# Patient Record
Sex: Female | Born: 1945 | Race: White | Hispanic: No | Smoking: Never smoker
Health system: Southern US, Community
[De-identification: ages and names within clinical notes are randomized; demographics above are authoritative.]

## PROBLEM LIST (undated history)

## (undated) DIAGNOSIS — E785 Hyperlipidemia, unspecified: Secondary | ICD-10-CM

## (undated) DIAGNOSIS — K219 Gastro-esophageal reflux disease without esophagitis: Secondary | ICD-10-CM

## (undated) DIAGNOSIS — F419 Anxiety disorder, unspecified: Secondary | ICD-10-CM

## (undated) HISTORY — DX: Hyperlipidemia, unspecified: E78.5

## (undated) HISTORY — DX: Gastro-esophageal reflux disease without esophagitis: K21.9

## (undated) HISTORY — PX: LITHOTRIPSY: SUR834

## (undated) HISTORY — DX: Anxiety disorder, unspecified: F41.9

---

## 2020-09-23 ENCOUNTER — Telehealth: Payer: Self-pay

## 2020-09-23 NOTE — Telephone Encounter (Signed)
Appointment change for February  Patient aware

## 2020-09-23 NOTE — Telephone Encounter (Signed)
Patient is rescheduled

## 2020-09-23 NOTE — Telephone Encounter (Signed)
Madison King is calling in her and her husband just moved here from [place] and have scheduled New Patient appointment's for March but are running to an issue with medication. They have tried calling their old doctor's office but because they are [place] of state they aren't able to get any refills. Shelsey wants to know if there is any way they can get in before March.

## 2020-10-08 ENCOUNTER — Other Ambulatory Visit: Payer: Self-pay

## 2020-10-08 ENCOUNTER — Encounter: Payer: Self-pay | Admitting: Family Medicine

## 2020-10-08 ENCOUNTER — Ambulatory Visit (INDEPENDENT_AMBULATORY_CARE_PROVIDER_SITE_OTHER): Payer: Medicare HMO | Admitting: Family Medicine

## 2020-10-08 VITALS — BP 154/77 | HR 55 | Temp 97.0°F | Ht 64.0 in | Wt 139.0 lb

## 2020-10-08 DIAGNOSIS — F419 Anxiety disorder, unspecified: Secondary | ICD-10-CM | POA: Insufficient documentation

## 2020-10-08 DIAGNOSIS — E2839 Other primary ovarian failure: Secondary | ICD-10-CM

## 2020-10-08 DIAGNOSIS — I10 Essential (primary) hypertension: Secondary | ICD-10-CM | POA: Diagnosis not present

## 2020-10-08 DIAGNOSIS — E559 Vitamin D deficiency, unspecified: Secondary | ICD-10-CM | POA: Diagnosis not present

## 2020-10-08 DIAGNOSIS — E785 Hyperlipidemia, unspecified: Secondary | ICD-10-CM

## 2020-10-08 DIAGNOSIS — K219 Gastro-esophageal reflux disease without esophagitis: Secondary | ICD-10-CM | POA: Insufficient documentation

## 2020-10-08 NOTE — Assessment & Plan Note (Signed)
Stable.  Continue Prilosec 20 mg daily. 

## 2020-10-08 NOTE — Assessment & Plan Note (Signed)
Slightly above goal though patient reports history of whitecoat hypertension.  She is on Bystolic 10 mg daily and tolerating well.  Continue home monitoring goal 150/90 per JNC 8 guidelines.

## 2020-10-08 NOTE — Progress Notes (Signed)
Madison King is a 75 y.o. female who presents today for an office visit.  Assessment/Plan:  Chronic Problems Addressed Today: Dyslipidemia We will obtain records from previous PCP regarding most recent lipid panel.  Continue pravastatin 20 mg daily.  She will follow-up in 1 year for next annual checkup with labs.  GERD (gastroesophageal reflux disease) Stable.  Continue Prilosec 20 mg daily.  Essential hypertension Slightly above goal though patient reports history of whitecoat hypertension.  She is on Bystolic 10 mg daily and tolerating well.  Continue home monitoring goal 150/90 per JNC 8 guidelines.  Vitamin D deficiency She takes vitamin D 2000 international units daily.  Will trend records from previous PCP.  Also update DEXA scan.  Anxiety Stable.  Uses Xanax very sparingly.  Database with no red flags.  Will refill as needed.  Preventative Healthcare Obtain records from previous PCP.  She should be up-to-date on colonoscopy.  She needs mammogram and bone density scan-we will put in bone density scan order and gave contact info for imaging center.  Will check labs at next office visit.  She is up-to-date on her vaccines.    Subjective:  HPI:  See A/P for status of chronic conditions.  ROS: Per HPI, otherwise a complete review of systems was negative.   PMH:  The following were reviewed and entered/updated in epic: Past Medical History:  Diagnosis Date  . Anxiety   . GERD (gastroesophageal reflux disease)   . Hyperlipidemia    Patient Active Problem List   Diagnosis Date Noted  . Anxiety 10/08/2020  . Vitamin D deficiency 10/08/2020  . Essential hypertension 10/08/2020  . GERD (gastroesophageal reflux disease) 10/08/2020  . Dyslipidemia 10/08/2020   Past Surgical History:  Procedure Laterality Date  . LITHOTRIPSY      Family History  Problem Relation Age of Onset  . Prostate cancer Father   . Colon cancer Neg Hx    Medications- reviewed and  updated Current Outpatient Medications  Medication Sig Dispense Refill  . ALPRAZolam (XANAX) 0.25 MG tablet Take 0.25 mg by mouth at bedtime as needed for anxiety.    . Ascorbic Acid (VITAMIN C) 500 MG CAPS Take by mouth.    . Cholecalciferol (VITAMIN D3) 50 MCG (2000 UT) TABS Take by mouth.    . nebivolol (BYSTOLIC) 10 MG tablet Take 10 mg by mouth.    Marland Kitchen omeprazole (PRILOSEC) 20 MG capsule Take 20 mg by mouth.    . pravastatin (PRAVACHOL) 10 MG tablet Take 10 mg by mouth.     No current facility-administered medications for this visit.    Allergies-reviewed and updated Allergies  Allergen Reactions  . Aspirin   . Latex   . Shellfish Allergy     Social History   Socioeconomic History  . Marital status: Married    Spouse name: Not on file  . Number of children: Not on file  . Years of education: Not on file  . Highest education level: Not on file  Occupational History  . Not on file  Tobacco Use  . Smoking status: Never Smoker  . Smokeless tobacco: Not on file  Substance and Sexual Activity  . Alcohol use: Never  . Drug use: Never  . Sexual activity: Not on file  Other Topics Concern  . Not on file  Social History Narrative  . Not on file   Social Determinants of Health   Financial Resource Strain: Not on file  Food Insecurity: Not on file  Transportation Needs:  Not on file  Physical Activity: Not on file  Stress: Not on file  Social Connections: Not on file        Objective:  Physical Exam: BP (!) 154/77   Pulse (!) 55   Temp (!) 97 F (36.1 C) (Temporal)   Ht 5\' 4"  (1.626 m)   Wt 139 lb (63 kg)   SpO2 96%   BMI 23.86 kg/m   Gen: No acute distress, resting comfortably CV: Regular rate and rhythm with no murmurs appreciated Pulm: Normal work of breathing, clear to auscultation bilaterally with no crackles, wheezes, or rhonchi Neuro: Grossly normal, moves all extremities Psych: Normal affect and thought content       Madison Hendel M. , MD 10/08/2020  12:11 PM

## 2020-10-08 NOTE — Patient Instructions (Addendum)
It was very nice to see you today!  It was very nice to meet you today.  Please keep an eye on your blood pressure let me know if it is persistently 150/90 or higher.  Should get your mammogram and bone density scan done soon.  Let me know when you need refill your medications.  I will see you back in a year for your annual check up with labs.  Please come back to see me sooner if needed.  Take care, Dr Jimmey Ralph  Please try these tips to maintain a healthy lifestyle:   Eat at least 3 REAL meals and 1-2 snacks per day.  Aim for no more than 5 hours between eating.  If you eat breakfast, please do so within one hour of getting up.    Each meal should contain half fruits/vegetables, one quarter protein, and one quarter carbs (no bigger than a computer mouse)   Cut down on sweet beverages. This includes juice, soda, and sweet tea.     Drink at least 1 glass of water with each meal and aim for at least 8 glasses per day   Exercise at least 150 minutes every week.

## 2020-10-08 NOTE — Assessment & Plan Note (Signed)
We will obtain records from previous PCP regarding most recent lipid panel.  Continue pravastatin 20 mg daily.  She will follow-up in 1 year for next annual checkup with labs.

## 2020-10-08 NOTE — Assessment & Plan Note (Signed)
Stable.  Uses Xanax very sparingly.  Database with no red flags.  Will refill as needed.

## 2020-10-08 NOTE — Assessment & Plan Note (Signed)
She takes vitamin D 2000 international units daily.  Will trend records from previous PCP.  Also update DEXA scan.

## 2020-10-11 ENCOUNTER — Other Ambulatory Visit: Payer: Self-pay | Admitting: Family Medicine

## 2020-10-11 DIAGNOSIS — Z1231 Encounter for screening mammogram for malignant neoplasm of breast: Secondary | ICD-10-CM

## 2020-11-17 ENCOUNTER — Ambulatory Visit: Payer: Medicare HMO | Admitting: Family Medicine

## 2020-11-26 ENCOUNTER — Ambulatory Visit
Admission: RE | Admit: 2020-11-26 | Discharge: 2020-11-26 | Disposition: A | Discharge status: Home / Self Care | Payer: Medicare HMO | Source: Ambulatory Visit | Attending: Family Medicine | Admitting: Family Medicine

## 2020-11-26 ENCOUNTER — Other Ambulatory Visit: Payer: Self-pay

## 2020-11-26 DIAGNOSIS — Z1231 Encounter for screening mammogram for malignant neoplasm of breast: Secondary | ICD-10-CM | POA: Diagnosis not present

## 2020-12-10 ENCOUNTER — Telehealth: Payer: Self-pay

## 2020-12-10 NOTE — Telephone Encounter (Signed)
Would be fine with trial of acupuncture but probably need to have her schedule an office visit here to discuss.  Madison King. Jimmey Ralph, MD 12/10/2020 1:45 PM

## 2020-12-10 NOTE — Telephone Encounter (Signed)
[employer] is calling in stating that patient is requesting a referral for out of network acupuncture. Insurance is needing to know if PCP will approve and to fax over office notes.   Fax # [phone] Case # [id number] Phone # [phone] ext. [phone]

## 2020-12-13 NOTE — Telephone Encounter (Signed)
Left message to return call to our office at their convenience to schedule appointment with PCP to discuss referral for acupuncture

## 2020-12-13 NOTE — Telephone Encounter (Signed)
Pt is scheduled °

## 2020-12-27 ENCOUNTER — Other Ambulatory Visit: Payer: Self-pay

## 2020-12-27 ENCOUNTER — Ambulatory Visit (INDEPENDENT_AMBULATORY_CARE_PROVIDER_SITE_OTHER): Payer: Medicare HMO | Admitting: Family Medicine

## 2020-12-27 ENCOUNTER — Encounter: Payer: Self-pay | Admitting: Family Medicine

## 2020-12-27 VITALS — BP 152/68 | HR 57 | Temp 97.6°F | Ht 64.0 in | Wt 140.4 lb

## 2020-12-27 DIAGNOSIS — M542 Cervicalgia: Secondary | ICD-10-CM | POA: Diagnosis not present

## 2020-12-27 DIAGNOSIS — I10 Essential (primary) hypertension: Secondary | ICD-10-CM

## 2020-12-27 DIAGNOSIS — G8929 Other chronic pain: Secondary | ICD-10-CM | POA: Diagnosis not present

## 2020-12-27 NOTE — Patient Instructions (Signed)
It was very nice to see you today!  I will place a referral for acupuncture.  Please let me know how this goes.  No other changes today.  Take care, Dr Jimmey Ralph  PLEASE NOTE:  If you had any lab tests please let us know if you have not heard back within a few days. You may see your results on mychart before we have a chance to review them but we will give you a call once they are reviewed by [place]. If we ordered any referrals today, please let us know if you have not heard from their office within the next week.   Please try these tips to maintain a healthy lifestyle:   Eat at least 3 REAL meals and 1-2 snacks per day.  Aim for no more than 5 hours between eating.  If you eat breakfast, please do so within one hour of getting up.    Each meal should contain half fruits/vegetables, one quarter protein, and one quarter carbs (no bigger than a computer mouse)   Cut down on sweet beverages. This includes juice, soda, and sweet tea.     Drink at least 1 glass of water with each meal and aim for at least 8 glasses per day   Exercise at least 150 minutes every week.

## 2020-12-27 NOTE — Assessment & Plan Note (Signed)
No red flags.  She has done very well with acupuncture.  Will place referral to have this done locally.  May consider referral to PT or sports med depending on her symptoms.

## 2020-12-27 NOTE — Assessment & Plan Note (Signed)
Slightly above goal.  She does have history of whitecoat hypertension.  Home readings have been within normal range.  Continue Bystolic 10 mg daily.  Continue home monitoring.
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
## 2021-01-11 ENCOUNTER — Telehealth: Payer: Self-pay

## 2021-01-11 NOTE — Telephone Encounter (Signed)
Patient returned call did not see any notes of anyone calling

## 2021-02-08 ENCOUNTER — Telehealth: Payer: Self-pay

## 2021-02-08 NOTE — Telephone Encounter (Signed)
  LAST APPOINTMENT DATE: 12/27/2020   NEXT APPOINTMENT DATE:@Visit  date not found  MEDICATION: ALPRAZolam (XANAX) 0.25 MG tablet  PHARMACY: [id number] - [place], [place] - [address]  Please advise

## 2021-02-09 MED ORDER — ALPRAZOLAM 0.25 MG PO TABS: 0.2500 mg | ORAL_TABLET | Freq: Every evening | ORAL | 5 refills | Status: DC | PRN

## 2021-02-09 NOTE — Addendum Note (Signed)
Addended by: Ardith Dark on: 02/09/2021 09:18 AM   Modules accepted: Orders

## 2021-02-09 NOTE — Telephone Encounter (Signed)
Pt is requesting Xanax 0.25 mg tab LOV: 12/27/2020 Next Visit: Not scheduled Last refill: 10/08/2020  Okay to refill?

## 2021-03-09 ENCOUNTER — Other Ambulatory Visit: Payer: Self-pay

## 2021-03-09 ENCOUNTER — Ambulatory Visit
Admission: RE | Admit: 2021-03-09 | Discharge: 2021-03-09 | Disposition: A | Discharge status: Home / Self Care | Payer: Medicare HMO | Source: Ambulatory Visit | Attending: Family Medicine | Admitting: Family Medicine

## 2021-03-09 DIAGNOSIS — E2839 Other primary ovarian failure: Secondary | ICD-10-CM

## 2021-03-09 DIAGNOSIS — Z780 Asymptomatic menopausal state: Secondary | ICD-10-CM | POA: Diagnosis not present

## 2021-03-09 DIAGNOSIS — M85832 Other specified disorders of bone density and structure, left forearm: Secondary | ICD-10-CM | POA: Diagnosis not present

## 2021-03-10 ENCOUNTER — Encounter: Payer: Self-pay | Admitting: Family Medicine

## 2021-03-10 DIAGNOSIS — M8580 Other specified disorders of bone density and structure, unspecified site: Secondary | ICD-10-CM | POA: Insufficient documentation

## 2021-03-10 NOTE — Progress Notes (Signed)
Please inform patient of the following:  Patient has thinning of the bones.  She should make sure that she is getting plenty of calcium and vitamin D in her diet.  We can recheck again in 2 years.

## 2021-03-16 ENCOUNTER — Telehealth: Payer: Self-pay | Admitting: Family Medicine

## 2021-03-16 NOTE — Telephone Encounter (Signed)
Copied from CRM [id number]. Topic: Medicare AWV >> Mar 16, 2021  9:20 AM Harris-Coley, Avon Gully wrote: Reason for CRM: Left message for patient to schedule Annual Wellness Visit.  Please schedule with Nurse Health Advisor Lanier Ensign, RN at [facility].

## 2021-05-16 DIAGNOSIS — M542 Cervicalgia: Secondary | ICD-10-CM | POA: Diagnosis not present

## 2021-06-13 DIAGNOSIS — M542 Cervicalgia: Secondary | ICD-10-CM | POA: Diagnosis not present

## 2021-06-20 ENCOUNTER — Ambulatory Visit (INDEPENDENT_AMBULATORY_CARE_PROVIDER_SITE_OTHER): Payer: Medicare HMO | Admitting: Family Medicine

## 2021-06-20 ENCOUNTER — Encounter: Payer: Self-pay | Admitting: Family Medicine

## 2021-06-20 ENCOUNTER — Other Ambulatory Visit: Payer: Self-pay

## 2021-06-20 VITALS — BP 145/78 | HR 55 | Temp 98.4°F | Ht 62.5 in | Wt 141.2 lb

## 2021-06-20 DIAGNOSIS — K219 Gastro-esophageal reflux disease without esophagitis: Secondary | ICD-10-CM

## 2021-06-20 DIAGNOSIS — Z0001 Encounter for general adult medical examination with abnormal findings: Secondary | ICD-10-CM | POA: Diagnosis not present

## 2021-06-20 DIAGNOSIS — M542 Cervicalgia: Secondary | ICD-10-CM | POA: Diagnosis not present

## 2021-06-20 DIAGNOSIS — G8929 Other chronic pain: Secondary | ICD-10-CM

## 2021-06-20 DIAGNOSIS — E785 Hyperlipidemia, unspecified: Secondary | ICD-10-CM

## 2021-06-20 DIAGNOSIS — E559 Vitamin D deficiency, unspecified: Secondary | ICD-10-CM | POA: Diagnosis not present

## 2021-06-20 DIAGNOSIS — Z6825 Body mass index (BMI) 25.0-25.9, adult: Secondary | ICD-10-CM | POA: Diagnosis not present

## 2021-06-20 DIAGNOSIS — Z1211 Encounter for screening for malignant neoplasm of colon: Secondary | ICD-10-CM

## 2021-06-20 DIAGNOSIS — I10 Essential (primary) hypertension: Secondary | ICD-10-CM

## 2021-06-20 DIAGNOSIS — E663 Overweight: Secondary | ICD-10-CM

## 2021-06-20 DIAGNOSIS — F419 Anxiety disorder, unspecified: Secondary | ICD-10-CM

## 2021-06-20 LAB — CBC
HCT: 42.9 % (ref 36.0–46.0)
Hemoglobin: 14.4 g/dL (ref 12.0–15.0)
MCHC: 33.7 g/dL (ref 30.0–36.0)
MCV: 91.6 fl (ref 78.0–100.0)
Platelets: 208 10*3/uL (ref 150.0–400.0)
RBC: 4.68 Mil/uL (ref 3.87–5.11)
RDW: 12.5 % (ref 11.5–15.5)
WBC: 5.7 10*3/uL (ref 4.0–10.5)

## 2021-06-20 LAB — COMPREHENSIVE METABOLIC PANEL
ALT: 11 U/L (ref 0–35)
AST: 20 U/L (ref 0–37)
Albumin: 4.2 g/dL (ref 3.5–5.2)
Alkaline Phosphatase: 56 U/L (ref 39–117)
BUN: 12 mg/dL (ref 6–23)
CO2: 30 mEq/L (ref 19–32)
Calcium: 9.2 mg/dL (ref 8.4–10.5)
Chloride: 105 mEq/L (ref 96–112)
Creatinine, Ser: 0.92 mg/dL (ref 0.40–1.20)
GFR: 61.58 mL/min (ref >60.00)
Glucose, Bld: 114 mg/dL — ABNORMAL HIGH (ref 70–99)
Potassium: 4.4 mEq/L (ref 3.5–5.1)
Sodium: 141 mEq/L (ref 135–145)
Total Bilirubin: 0.8 mg/dL (ref 0.2–1.2)
Total Protein: 6.7 g/dL (ref 6.0–8.3)

## 2021-06-20 LAB — LIPID PANEL
Cholesterol: 192 mg/dL (ref 0–200)
HDL: 40.5 mg/dL (ref >39.00)
LDL Cholesterol: 127 mg/dL — ABNORMAL HIGH (ref 0–99)
NonHDL: 151.69
Total CHOL/HDL Ratio: 5
Triglycerides: 125 mg/dL (ref 0.0–149.0)
VLDL: 25 mg/dL (ref 0.0–40.0)

## 2021-06-20 LAB — VITAMIN D 25 HYDROXY (VIT D DEFICIENCY, FRACTURES): VITD: 35.39 ng/mL (ref 30.00–100.00)

## 2021-06-20 LAB — TSH: TSH: 1.18 u[IU]/mL (ref 0.35–5.50)

## 2021-06-20 NOTE — Progress Notes (Signed)
Chief Complaint:  Unnamed Madison King is a 75 y.o. female who presents today for her annual comprehensive physical exam.    Assessment/Plan:  Chronic Problems Addressed Today: Chronic neck pain Following with PM&R and transfer orthopedics.  Has had 1 session of acupuncture there which is worked well.  She has had this done for several years in the past in [place].  Dyslipidemia On pravastatin 10 mg daily.  Check lipids today.  GERD (gastroesophageal reflux disease) On omeprazole 20 mg daily. Stable.   Essential hypertension At goal per JNC-8 on Bystolic 10 mg daily.  Check labs.  Vitamin D deficiency Check vit D.   Anxiety Stable. Very rarely uses xanax as needed.  Does not need refill today.   Body mass index is 25.42 kg/m. / Overweight  BMI Metric Follow Up - 06/20/21 1058       BMI Metric Follow Up-Please document annually   BMI Metric Follow Up Education provided              Preventative Healthcare: Check Labs. Cologuard ordered.   Patient Counseling(The following topics were reviewed and/or handout was given):  -Nutrition: Stressed importance of moderation in sodium/caffeine intake, saturated fat and cholesterol, caloric balance, sufficient intake of fresh fruits, vegetables, and fiber.  -Stressed the importance of regular exercise.   -Substance Abuse: Discussed cessation/primary prevention of tobacco, alcohol, or other drug use; driving or other dangerous activities under the influence; availability of treatment for abuse.   -Injury prevention: Discussed safety belts, safety helmets, smoke detector, smoking near bedding or upholstery.   -Sexuality: Discussed sexually transmitted diseases, partner selection, use of condoms, avoidance of unintended pregnancy and contraceptive alternatives.   -Dental health: Discussed importance of regular tooth brushing, flossing, and dental visits.  -Health maintenance and immunizations reviewed. Please refer to Health maintenance  section.  Return to care in 1 year for next preventative visit.     Subjective:  HPI:  She has no acute complaints today.   See A/p for status of chronic conditions.   Lifestyle Diet: Can improve diet by reducing carb intake Exercise: Exercises, but no stated regimen  Depression screen [facility] 2/9 10/08/2020  Decreased Interest 0  Down, Depressed, Hopeless 0  PHQ - 2 Score 0    Health Maintenance Due  Topic Date Due   Hepatitis C Screening  Never done   TETANUS/TDAP  Never done   Fecal DNA (Cologuard)  Never done   Zoster Vaccines- Shingrix (1 of 2) Never done     ROS: Per HPI, otherwise a complete review of systems was negative.   PMH:  The following were reviewed and entered/updated in epic: Past Medical History:  Diagnosis Date   Anxiety    GERD (gastroesophageal reflux disease)    Hyperlipidemia    Patient Active Problem List   Diagnosis Date Noted   Osteopenia - Last DEXA 2022 03/10/2021   Chronic neck pain 12/27/2020   Anxiety 10/08/2020   Vitamin D deficiency 10/08/2020   Essential hypertension 10/08/2020   GERD (gastroesophageal reflux disease) 10/08/2020   Dyslipidemia 10/08/2020   Past Surgical History:  Procedure Laterality Date   LITHOTRIPSY      Family History  Problem Relation Age of Onset   Prostate cancer Father    Heart disease Father    Arthritis Mother    Hearing loss Mother    Cancer Brother    Arthritis Maternal Grandmother    Miscarriages / Stillbirths Maternal Grandmother    Heart disease Maternal Grandfather  Cancer Paternal Grandfather    Colon cancer Neg Hx     Medications- reviewed and updated Current Outpatient Medications  Medication Sig Dispense Refill   ALPRAZolam (XANAX) 0.25 MG tablet Take 1 tablet (0.25 mg total) by mouth at bedtime as needed for anxiety. 30 tablet 5   Ascorbic Acid (VITAMIN C) 500 MG CAPS Take by mouth.     Cholecalciferol (VITAMIN D3) 50 MCG (2000 UT) TABS Take by mouth.     nebivolol  (BYSTOLIC) 10 MG tablet Take 10 mg by mouth.     omeprazole (PRILOSEC) 20 MG capsule Take 20 mg by mouth.     pravastatin (PRAVACHOL) 10 MG tablet Take 10 mg by mouth.     No current facility-administered medications for this visit.    Allergies-reviewed and updated Allergies  Allergen Reactions   Aspirin    Latex    Shellfish Allergy     Social History   Socioeconomic History   Marital status: Divorced    Spouse name: Not on file   Number of children: Not on file   Years of education: Not on file   Highest education level: Not on file  Occupational History   Not on file  Tobacco Use   Smoking status: Never   Smokeless tobacco: Not on file  Substance and Sexual Activity   Alcohol use: Never   Drug use: Never   Sexual activity: Not on file  Other Topics Concern   Not on file  Social History Narrative   Not on file   Social Determinants of Health   Financial Resource Strain: Not on file  Food Insecurity: Not on file  Transportation Needs: Not on file  Physical Activity: Not on file  Stress: Not on file  Social Connections: Not on file        Objective:  Physical Exam: BP (!) 145/78   Pulse (!) 55   Temp 98.4 F (36.9 C)   Ht 5' 2.5" (1.588 m)   Wt 141 lb 4 oz (64.1 kg)   SpO2 99%   BMI 25.42 kg/m   Body mass index is 25.42 kg/m. Wt Readings from Last 3 Encounters:  06/20/21 141 lb 4 oz (64.1 kg)  12/27/20 140 lb 6.4 oz (63.7 kg)  10/08/20 139 lb (63 kg)   Gen: NAD, resting comfortably HEENT: TMs normal bilaterally. OP clear. No thyromegaly noted.  CV: RRR with no murmurs appreciated Pulm: NWOB, CTAB with no crackles, wheezes, or rhonchi GI: Normal bowel sounds present. Soft, Nontender, Nondistended. MSK: no edema, cyanosis, or clubbing noted Skin: warm, dry Neuro: CN2-12 grossly intact. Strength 5/5 in upper and lower extremities. Reflexes symmetric and intact bilaterally.  Psych: Normal affect and thought content     Cambreigh Dearing M. Jimmey Ralph,  MD 06/20/2021 10:58 AM

## 2021-06-20 NOTE — Assessment & Plan Note (Signed)
On pravastatin 10 mg daily.  Check lipids today.

## 2021-06-20 NOTE — Patient Instructions (Signed)
It was very nice to see you today!  We will Check blood work today  I will order the cologuard.  We will see back in 1 year.  Come back sooner if needed.  Take care, Dr Jerline Pain  PLEASE NOTE:  If you had any lab tests please let us know if you have not heard back within a few days. You may see your results on mychart before we have a chance to review them but we will give you a call once they are reviewed by [place]. If we ordered any referrals today, please let us know if you have not heard from their office within the next week.   Please try these tips to maintain a healthy lifestyle:  Eat at least 3 REAL meals and 1-2 snacks per day.  Aim for no more than 5 hours between eating.  If you eat breakfast, please do so within one hour of getting up.   Each meal should contain half fruits/vegetables, one quarter protein, and one quarter carbs (no bigger than a computer mouse)  Cut down on sweet beverages. This includes juice, soda, and sweet tea.   Drink at least 1 glass of water with each meal and aim for at least 8 glasses per day  Exercise at least 150 minutes every week.    Preventive Care 65 Years and Older, Female Preventive care refers to lifestyle choices and visits with your health care provider that can promote health and wellness. This includes: A yearly physical exam. This is also called an annual wellness visit. Regular dental and eye exams. Immunizations. Screening for certain conditions. Healthy lifestyle choices, such as: Eating a healthy diet. Getting regular exercise. Not using drugs or products that contain nicotine and tobacco. Limiting alcohol use. What can I expect for my preventive care visit? Physical exam Your health care provider will check your: Height and weight. These may be used to calculate your BMI (body mass index). BMI is a measurement that tells if you are at a healthy weight. Heart rate and blood pressure. Body temperature. Skin for abnormal  spots. Counseling Your health care provider may ask you questions about your: Past medical problems. Family's medical history. Alcohol, tobacco, and drug use. Emotional well-being. Home life and relationship well-being. Sexual activity. Diet, exercise, and sleep habits. History of falls. Memory and ability to understand (cognition). Work and work Statistician. Pregnancy and menstrual history. Access to firearms. What immunizations do I need? Vaccines are usually given at various ages, according to a schedule. Your health care provider will recommend vaccines for you based on your age, medical history, and lifestyle or other factors, such as travel or where you work. What tests do I need? Blood tests Lipid and cholesterol levels. These may be checked every 5 years, or more often depending on your overall health. Hepatitis C test. Hepatitis B test. Screening Lung cancer screening. You may have this screening every year starting at age 78 if you have a 30-pack-year history of smoking and currently smoke or have quit within the past 15 years. Colorectal cancer screening. All adults should have this screening starting at age 90 and continuing until age 93. Your health care provider may recommend screening at age 50 if you are at increased risk. You will have tests every 1-10 years, depending on your results and the type of screening test. Diabetes screening. This is done by checking your blood sugar (glucose) after you have not eaten for a while (fasting). You may have this done  every 1-3 years. Mammogram. This may be done every 1-2 years. Talk with your health care provider about how often you should have regular mammograms. Abdominal aortic aneurysm (AAA) screening. You may need this if you are a current or former smoker. BRCA-related cancer screening. This may be done if you have a family history of breast, ovarian, tubal, or peritoneal cancers. Other tests STD (sexually transmitted  disease) testing, if you are at risk. Bone density scan. This is done to screen for osteoporosis. You may have this done starting at age 62. Talk with your health care provider about your test results, treatment options, and if necessary, the need for more tests. Follow these instructions at home: Eating and drinking  Eat a diet that includes fresh fruits and vegetables, whole grains, lean protein, and low-fat dairy products. Limit your intake of foods with high amounts of sugar, saturated fats, and salt. Take vitamin and mineral supplements as recommended by your health care provider. Do not drink alcohol if your health care provider tells you not to drink. If you drink alcohol: Limit how much you have to 0-1 drink a day. Be aware of how much alcohol is in your drink. In the [place] one drink equals one 12 oz bottle of beer (355 mL), one 5 oz glass of wine (148 mL), or one 1 oz glass of hard liquor (44 mL). Lifestyle Take daily care of your teeth and gums. Brush your teeth every morning and night with fluoride toothpaste. Floss one time each day. Stay active. Exercise for at least 30 minutes 5 or more days each week. Do not use any products that contain nicotine or tobacco, such as cigarettes, e-cigarettes, and chewing tobacco. If you need help quitting, ask your health care provider. Do not use drugs. If you are sexually active, practice safe sex. Use a condom or other form of protection in order to prevent STIs (sexually transmitted infections). Talk with your health care provider about taking a low-dose aspirin or statin. Find healthy ways to cope with stress, such as: Meditation, yoga, or listening to music. Journaling. Talking to a trusted person. Spending time with friends and family. Safety Always wear your seat belt while driving or riding in a vehicle. Do not drive: If you have been drinking alcohol. Do not ride with someone who has been drinking. When you are tired or  distracted. While texting. Wear a helmet and other protective equipment during sports activities. If you have firearms in your house, make sure you follow all gun safety procedures. What's next? Visit your health care provider once a year for an annual wellness visit. Ask your health care provider how often you should have your eyes and teeth checked. Stay up to date on all vaccines. This information is not intended to replace advice given to you by your health care provider. Make sure you discuss any questions you have with your health care provider. Document Revised: 10/29/2020 Document Reviewed: 08/15/2018 Elsevier Patient Education  2022 [employer].

## 2021-06-20 NOTE — Assessment & Plan Note (Signed)
On omeprazole 20 mg daily. Stable.

## 2021-06-20 NOTE — Assessment & Plan Note (Signed)
At goal per JNC-8 on Bystolic 10 mg daily.  Check labs.

## 2021-06-20 NOTE — Assessment & Plan Note (Signed)
Stable. Very rarely uses xanax as needed.  Does not need refill today.

## 2021-06-20 NOTE — Assessment & Plan Note (Addendum)
Following with [facility] and transfer orthopedics.  Has had 1 session of acupuncture there which is worked well.  She has had this done for several years in the past in [place].

## 2021-06-20 NOTE — Assessment & Plan Note (Signed)
Check vit D 

## 2021-06-21 NOTE — Progress Notes (Signed)
Please inform patient of the following:  Her "bad" cholesterol and glucose are a little elevated but everything else is NORMAL. Do not need to make any changes to her treatment plan at this time. We can recheck in a year.  Madison King. Jimmey Ralph, MD 06/21/2021 7:46 AM

## 2021-06-24 DIAGNOSIS — M542 Cervicalgia: Secondary | ICD-10-CM | POA: Diagnosis not present

## 2021-07-04 DIAGNOSIS — M542 Cervicalgia: Secondary | ICD-10-CM | POA: Diagnosis not present

## 2021-07-11 DIAGNOSIS — M542 Cervicalgia: Secondary | ICD-10-CM | POA: Diagnosis not present

## 2021-07-25 ENCOUNTER — Telehealth: Payer: Self-pay

## 2021-07-25 NOTE — Telephone Encounter (Signed)
Pt called wanting to know what chiropractor Dr Jimmey Ralph would recommend. Pt would like a call back. Please Advise.

## 2021-07-26 NOTE — Telephone Encounter (Signed)
I have no recommendations for a specific chiropractor. If she is looking for osteopathic adjustment then I would recommend Dr Berline Chough at [facility].  Katina Degree. Jimmey Ralph, MD 07/26/2021 8:33 AM

## 2021-07-26 NOTE — Telephone Encounter (Signed)
Left message to return call to office.

## 2021-07-27 ENCOUNTER — Encounter: Payer: Self-pay | Admitting: *Deleted

## 2021-07-27 NOTE — Telephone Encounter (Signed)
Patient notified

## 2021-07-30 ENCOUNTER — Encounter: Payer: Self-pay | Admitting: Family Medicine

## 2021-08-02 NOTE — Telephone Encounter (Signed)
Pt is scheduled °

## 2021-08-02 NOTE — Telephone Encounter (Signed)
Please see message. °

## 2021-08-09 ENCOUNTER — Other Ambulatory Visit: Payer: Self-pay | Admitting: Family Medicine

## 2021-08-10 ENCOUNTER — Other Ambulatory Visit: Payer: Self-pay

## 2021-08-10 ENCOUNTER — Encounter: Payer: Self-pay | Admitting: Family Medicine

## 2021-08-10 ENCOUNTER — Ambulatory Visit (INDEPENDENT_AMBULATORY_CARE_PROVIDER_SITE_OTHER): Payer: Medicare HMO | Admitting: Family Medicine

## 2021-08-10 VITALS — BP 157/84 | HR 57 | Temp 97.6°F | Ht 62.0 in | Wt 144.0 lb

## 2021-08-10 DIAGNOSIS — M542 Cervicalgia: Secondary | ICD-10-CM

## 2021-08-10 DIAGNOSIS — M21611 Bunion of right foot: Secondary | ICD-10-CM | POA: Diagnosis not present

## 2021-08-10 DIAGNOSIS — G8929 Other chronic pain: Secondary | ICD-10-CM

## 2021-08-10 DIAGNOSIS — E785 Hyperlipidemia, unspecified: Secondary | ICD-10-CM

## 2021-08-10 DIAGNOSIS — I10 Essential (primary) hypertension: Secondary | ICD-10-CM

## 2021-08-10 MED ORDER — PRAVASTATIN SODIUM 10 MG PO TABS: 10.0000 mg | ORAL_TABLET | Freq: Every day | ORAL | 3 refills | Status: DC

## 2021-08-10 MED ORDER — NEBIVOLOL HCL 10 MG PO TABS: 10.0000 mg | ORAL_TABLET | Freq: Every day | ORAL | 3 refills | Status: DC

## 2021-08-10 NOTE — Assessment & Plan Note (Signed)
No red flags.  Unfortunately insurance did not pay for acupuncture and she has had trouble with chiropractor.  Will place referral to sports medicine for further evaluation.  She is interested in osteopathic adjustment and possibly dry needling.  Recommended Voltaren gel.

## 2021-08-10 NOTE — Progress Notes (Signed)
   Madison King is a 75 y.o. female who presents today for an office visit.  Assessment/Plan:  New/Acute Problems: Bunion Will refer to podiatry.   Chronic Problems Addressed Today: Chronic neck pain No red flags.  Unfortunately insurance did not pay for acupuncture and she has had trouble with chiropractor.  Will place referral to sports medicine for further evaluation.  She is interested in osteopathic adjustment and possibly dry needling.  Recommended Voltaren gel.  Dyslipidemia Refill pravastatin today.     Subjective:  HPI:  She has a few issues that she would like to discuss today.  Please see A/P for status of her chronic conditions.  Her blood pressure is elevated at this visit. 157/84. She does not check her blood pressure a home because her cuff was giving inconsistent readings. Might be "white-coat syndrome".   She has also had ongoing issues with neck and upper back pain.  This has been going on for over a year.  She went to an acupuncturist but her insurance did not cover it. [facility] to [facility] and was recommended to get a cervical and lumbar x-ray for right shoulder and neck pain.  Her insurance would not cover chiropractor visits until she has an x-ray.  Has been using topical OTC creams which bring little relief. She adds that she bends over reading a lot.   She reports a soft bunion on her big right toe that is non-tender this has been present for months and is progressively worsening.   Would like a refill on 10 mg bystolic and 10 mg pravastatin.       Objective:  Physical Exam: BP (!) 157/84 (BP Location: Left Arm)   Pulse (!) 57   Temp 97.6 F (36.4 C) (Temporal)   Ht 5\' 2"  (1.575 m)   Wt 144 lb (65.3 kg)   BMI 26.34 kg/m   Gen: No acute distress, resting comfortably CV: Regular rate and rhythm with no murmurs appreciated Pulm: Normal work of breathing, clear to auscultation bilaterally with no crackles, wheezes, or rhonchi Neuro: Grossly normal,  moves all extremities Psych: Normal affect and thought content Foot: Bunion on right toe       I,Madison King,acting as a scribe for , MD.,have documented all relevant documentation on the behalf of Madison Doe, MD,as directed by  Madison Doe, MD while in the presence of Madison Doe, MD.   I, Madison Doe, MD, have reviewed all documentation for this visit. The documentation on 08/10/21 for the exam, diagnosis, procedures, and orders are all accurate and complete.  14/07/22. Madison Degree, MD 08/10/2021 11:33 AM

## 2021-08-10 NOTE — Assessment & Plan Note (Signed)
Refill pravastatin today.

## 2021-08-10 NOTE — Patient Instructions (Signed)
It was very nice to see you today!  I will refer you to see a podiatrist for the bunion on your left foot.  I would like for you to see a sports medicine doctor for your upper back and neck pain.  They can help with any sort of adjustments.  They may refer you to see a physical therapist for dry needling.  You could try using Voltaren gel as well.  I will refill your Bystolic and pravastatin today.  Please keep an eye on your blood pressure at home and let me know if it is persistently 140/90 or higher.  Take care, Dr Jimmey Ralph  PLEASE NOTE:  If you had any lab tests please let us know if you have not heard back within a few days. You may see your results on mychart before we have a chance to review them but we will give you a call once they are reviewed by [place]. If we ordered any referrals today, please let us know if you have not heard from their office within the next week.   Please try these tips to maintain a healthy lifestyle:  Eat at least 3 REAL meals and 1-2 snacks per day.  Aim for no more than 5 hours between eating.  If you eat breakfast, please do so within one hour of getting up.   Each meal should contain half fruits/vegetables, one quarter protein, and one quarter carbs (no bigger than a computer mouse)  Cut down on sweet beverages. This includes juice, soda, and sweet tea.   Drink at least 1 glass of water with each meal and aim for at least 8 glasses per day  Exercise at least 150 minutes every week.

## 2021-08-22 ENCOUNTER — Ambulatory Visit (INDEPENDENT_AMBULATORY_CARE_PROVIDER_SITE_OTHER): Payer: Medicare HMO

## 2021-08-22 ENCOUNTER — Encounter: Payer: Self-pay | Admitting: Podiatry

## 2021-08-22 ENCOUNTER — Ambulatory Visit: Payer: Medicare HMO | Admitting: Podiatry

## 2021-08-22 ENCOUNTER — Other Ambulatory Visit: Payer: Self-pay

## 2021-08-22 DIAGNOSIS — M2011 Hallux valgus (acquired), right foot: Secondary | ICD-10-CM | POA: Diagnosis not present

## 2021-08-22 DIAGNOSIS — M67471 Ganglion, right ankle and foot: Secondary | ICD-10-CM | POA: Diagnosis not present

## 2021-08-22 NOTE — Progress Notes (Signed)
°  Subjective:  Patient ID: Madison King, female    DOB: 22-Apr-1946,   MRN: [id number]  Chief Complaint  Patient presents with   Foot Pain    1st MPJ right - soft lump x 2 months, increase in size, not sore, PCP referred for possibly to drain cyst   New Patient (Initial Visit)    75 y.o. female presents for concern of right foot lump present for about two months which has increased in size. Denies any pain. Was referred here by PCP to evaluate for possible cyst. Denies any other treatments. Denies any other pedal complaints. Denies n/v/f/c.   Past Medical History:  Diagnosis Date   Anxiety    GERD (gastroesophageal reflux disease)    Hyperlipidemia     Objective:  Physical Exam: Vascular: DP/PT pulses 2/4 bilateral. CFT <3 seconds. Normal hair growth on digits. No edema.  Skin. No lacerations or abrasions bilateral feet. 1cm fluctuant mass non mobile noted to the medial eminence of right first metatarsophalangeal joint.  Musculoskeletal: MMT 5/5 bilateral lower extremities in DF, PF, Inversion and Eversion. Deceased ROM in DF of ankle joint.  Hallux abductovalgus deformity noted bilateral. No pain to palpation.  Neurological: Sensation intact to light touch.   Assessment:   1. Ganglion cyst of right foot   2. Hav (hallux abducto valgus), right      Plan:  Patient was evaluated and treated and all questions answered. -Xrays reviewed. Mild hav deformity noted with soft tissue swelling noted at medial eminence. Mild degenerative changes noted.  Discussed ganglion cysts and treatment options with the patient. Patient elected to go aspiration of the cyst today.  Procedure note below. Advised patient on postprocedure protocol. -Discussed HAV and treatment options;conservative and surgical management; risks, benefits, alternatives discussed. All patient's questions answered. -Discussed padding and wide shoe gear.   -Recommend continue with good supportive shoes and inserts -Patient to  return to office as needed or sooner if condition worsens.   Procedure: Aspiration cyst, right first metatarsal Discussed alternatives, risks, complications and verbal consent was obtained.  Location: Right medial first metatarsal . Skin Prep: Alcohol. Injectate: 3 cc 1 % lidocaine.  Aspirated cyst with 18 gauge needle, about 3 cc of gel like viscous fluid aspirated consistent with ganglion cyst Disposition: Patient tolerated procedure well. Injection site dressed with a band-aid. Compression bandage applied.  Post-procedure care was discussed and return precautions discussed.     Louann Sjogren, DPM

## 2021-09-12 ENCOUNTER — Ambulatory Visit: Payer: Medicare HMO | Admitting: Sports Medicine

## 2021-10-10 ENCOUNTER — Other Ambulatory Visit: Payer: Self-pay

## 2021-10-10 ENCOUNTER — Encounter: Payer: Self-pay | Admitting: Podiatry

## 2021-10-10 ENCOUNTER — Ambulatory Visit: Payer: Medicare HMO | Admitting: Podiatry

## 2021-10-10 DIAGNOSIS — M67471 Ganglion, right ankle and foot: Secondary | ICD-10-CM

## 2021-10-10 DIAGNOSIS — M2011 Hallux valgus (acquired), right foot: Secondary | ICD-10-CM

## 2021-10-10 NOTE — Progress Notes (Signed)
°  Subjective:  Patient ID: Madison King, female    DOB: July 27, 1946,   MRN: [id number]  Chief Complaint  Patient presents with   Ganglion Cyst     Right foot cyst needs to be drained     76 y.o. female presents for follow-up of right great toe ganglion cyst that has returned.  Denies any pain.  Denies any other treatments. Denies any other pedal complaints. Denies n/v/f/c.   Past Medical History:  Diagnosis Date   Anxiety    GERD (gastroesophageal reflux disease)    Hyperlipidemia     Objective:  Physical Exam: Vascular: DP/PT pulses 2/4 bilateral. CFT <3 seconds. Normal hair growth on digits. No edema.  Skin. No lacerations or abrasions bilateral feet. 1cm fluctuant mass non mobile noted to the medial eminence of right first metatarsophalangeal joint.  Musculoskeletal: MMT 5/5 bilateral lower extremities in DF, PF, Inversion and Eversion. Deceased ROM in DF of ankle joint.  Hallux abductovalgus deformity noted bilateral. No pain to palpation.  Neurological: Sensation intact to light touch.   Assessment:   1. Ganglion cyst of right foot   2. Hav (hallux abducto valgus), right      Plan:  Patient was evaluated and treated and all questions answered. -Xrays reviewed. Discussed ganglion cysts and treatment options with the patient. Patient elected to go aspiration of the cyst today.  Procedure note below. Advised patient on postprocedure protocol. Discussed if cyst return getting MRI and discussing surgical removal.  -Discussed padding and wide shoe gear.   -Recommend continue with good supportive shoes and inserts -Patient to return to office as needed or sooner if condition worsens.   Procedure: Aspiration cyst, right first metatarsal Discussed alternatives, risks, complications and verbal consent was obtained.  Location: Right medial first metatarsal . Skin Prep: Alcohol. Injectate: 3 cc 1 % lidocaine.  Aspirated cyst with 18 gauge needle, about 3 cc of gel like viscous  fluid aspirated consistent with ganglion cyst Disposition: Patient tolerated procedure well. Injection site dressed with a band-aid. Compression bandage applied.  Post-procedure care was discussed and return precautions discussed.     Louann Sjogren, DPM

## 2021-10-27 ENCOUNTER — Other Ambulatory Visit: Payer: Self-pay | Admitting: Family Medicine

## 2021-10-27 DIAGNOSIS — Z1231 Encounter for screening mammogram for malignant neoplasm of breast: Secondary | ICD-10-CM

## 2021-11-10 ENCOUNTER — Telehealth: Payer: Self-pay | Admitting: Family Medicine

## 2021-11-10 NOTE — Telephone Encounter (Signed)
Copied from CRM [id number]. Topic: Medicare AWV ?>> Nov 10, 2021 11:44 AM Harris-Coley, Avon Gully wrote: ?Reason for CRM: Left message for patient to schedule Annual Wellness Visit.  Please schedule with Nurse Health Advisor Lanier Ensign, RN at [facility].  Please call [phone] ask for Olegario Messier ?

## 2021-11-29 ENCOUNTER — Ambulatory Visit
Admission: RE | Admit: 2021-11-29 | Discharge: 2021-11-29 | Disposition: A | Discharge status: Home / Self Care | Payer: Medicare HMO | Source: Ambulatory Visit | Attending: Family Medicine | Admitting: Family Medicine

## 2021-11-29 DIAGNOSIS — Z1231 Encounter for screening mammogram for malignant neoplasm of breast: Secondary | ICD-10-CM | POA: Diagnosis not present

## 2021-12-05 ENCOUNTER — Ambulatory Visit (INDEPENDENT_AMBULATORY_CARE_PROVIDER_SITE_OTHER): Payer: Medicare HMO

## 2021-12-05 DIAGNOSIS — Z0000 Encounter for general adult medical examination without abnormal findings: Secondary | ICD-10-CM

## 2021-12-05 NOTE — Patient Instructions (Signed)
Madison King , ?Thank you for taking time to come for your Medicare Wellness Visit. I appreciate your ongoing commitment to your health goals. Please review the following plan we discussed and let me know if I can assist you in the future.  ? ?Screening recommendations/referrals: ?Colonoscopy: Pt has cologuard kit  ?Mammogram: Done 11/29/21 repeat every year  ?Bone Density: Done 03/09/21 repeat every 2 years  ?Recommended yearly ophthalmology/optometry visit for glaucoma screening and checkup ?Recommended yearly dental visit for hygiene and checkup ? ?Vaccinations: ?Influenza vaccine: Done 05/24/21 repeat every year  ?Pneumococcal vaccine: Up to date ?Tdap vaccine: Due, to be completed every 10 years  ?Shingles vaccine: Done 07/21/19 & 10/25/20   ?Covid-19:Completed 3/16, 4/7, 07/07/20 & 06/10/21 ? ?Advanced directives: total hip arthroplasty ? ?Conditions/risks identified: None at this time ? ?Next appointment: Follow up in one year for your annual wellness visit  ? ? ?Preventive Care 75 Years and Older, Female ?Preventive care refers to lifestyle choices and visits with your health care provider that can promote health and wellness. ?What does preventive care include? ?A yearly physical exam. This is also called an annual well check. ?Dental exams once or twice a year. ?Routine eye exams. Ask your health care provider how often you should have your eyes checked. ?Personal lifestyle choices, including: ?Daily care of your teeth and gums. ?Regular physical activity. ?Eating a healthy diet. ?Avoiding tobacco and drug use. ?Limiting alcohol use. ?Practicing safe sex. ?Taking low-dose aspirin every day. ?Taking vitamin and mineral supplements as recommended by your health care provider. ?What happens during an annual well check? ?The services and screenings done by your health care provider during your annual well check will depend on your age, overall health, lifestyle risk factors, and family history of disease. ?Counseling   ?Your health care provider may ask you questions about your: ?Alcohol use. ?Tobacco use. ?Drug use. ?Emotional well-being. ?Home and relationship well-being. ?Sexual activity. ?Eating habits. ?History of falls. ?Memory and ability to understand (cognition). ?Work and work Statistician. ?Reproductive health. ?Screening  ?You may have the following tests or measurements: ?Height, weight, and BMI. ?Blood pressure. ?Lipid and cholesterol levels. These may be checked every 5 years, or more frequently if you are over 81 years old. ?Skin check. ?Lung cancer screening. You may have this screening every year starting at age 70 if you have a 30-pack-year history of smoking and currently smoke or have quit within the past 15 years. ?Fecal occult blood test (FOBT) of the stool. You may have this test every year starting at age 52. ?Flexible sigmoidoscopy or colonoscopy. You may have a sigmoidoscopy every 5 years or a colonoscopy every 10 years starting at age 10. ?Hepatitis C blood test. ?Hepatitis B blood test. ?Sexually transmitted disease (STD) testing. ?Diabetes screening. This is done by checking your blood sugar (glucose) after you have not eaten for a while (fasting). You may have this done every 1-3 years. ?Bone density scan. This is done to screen for osteoporosis. You may have this done starting at age 6. ?Mammogram. This may be done every 1-2 years. Talk to your health care provider about how often you should have regular mammograms. ?Talk with your health care provider about your test results, treatment options, and if necessary, the need for more tests. ?Vaccines  ?Your health care provider may recommend certain vaccines, such as: ?Influenza vaccine. This is recommended every year. ?Tetanus, diphtheria, and acellular pertussis (Tdap, Td) vaccine. You may need a Td booster every 10 years. ?Zoster vaccine.  You may need this after age 30. ?Pneumococcal 13-valent conjugate (PCV13) vaccine. One dose is recommended  after age 8. ?Pneumococcal polysaccharide (PPSV23) vaccine. One dose is recommended after age 19. ?Talk to your health care provider about which screenings and vaccines you need and how often you need them. ?This information is not intended to replace advice given to you by your health care provider. Make sure you discuss any questions you have with your health care provider. ?Document Released: 09/17/2015 Document Revised: 05/10/2016 Document Reviewed: 06/22/2015 ?Elsevier Interactive Patient Education ? 2017 [place]. ? ?Fall Prevention in the Home ?Falls can cause injuries. They can happen to people of all ages. There are many things you can do to make your home safe and to help prevent falls. ?What can I do on the outside of my home? ?Regularly fix the edges of walkways and driveways and fix any cracks. ?Remove anything that might make you trip as you walk through a door, such as a raised step or threshold. ?Trim any bushes or trees on the path to your home. ?Use bright outdoor lighting. ?Clear any walking paths of anything that might make someone trip, such as rocks or tools. ?Regularly check to see if handrails are loose or broken. Make sure that both sides of any steps have handrails. ?Any raised decks and porches should have guardrails on the edges. ?Have any leaves, snow, or ice cleared regularly. ?Use sand or salt on walking paths during winter. ?Clean up any spills in your garage right away. This includes oil or grease spills. ?What can I do in the bathroom? ?Use night lights. ?Install grab bars by the toilet and in the tub and shower. Do not use towel bars as grab bars. ?Use non-skid mats or decals in the tub or shower. ?If you need to sit down in the shower, use a plastic, non-slip stool. ?Keep the floor dry. Clean up any water that spills on the floor as soon as it happens. ?Remove soap buildup in the tub or shower regularly. ?Attach bath mats securely with double-sided non-slip rug tape. ?Do not  have throw rugs and other things on the floor that can make you trip. ?What can I do in the bedroom? ?Use night lights. ?Make sure that you have a light by your bed that is easy to reach. ?Do not use any sheets or blankets that are too big for your bed. They should not hang down onto the floor. ?Have a firm chair that has side arms. You can use this for support while you get dressed. ?Do not have throw rugs and other things on the floor that can make you trip. ?What can I do in the kitchen? ?Clean up any spills right away. ?Avoid walking on wet floors. ?Keep items that you use a lot in easy-to-reach places. ?If you need to reach something above you, use a strong step stool that has a grab bar. ?Keep electrical cords out of the way. ?Do not use floor polish or wax that makes floors slippery. If you must use wax, use non-skid floor wax. ?Do not have throw rugs and other things on the floor that can make you trip. ?What can I do with my stairs? ?Do not leave any items on the stairs. ?Make sure that there are handrails on both sides of the stairs and use them. Fix handrails that are broken or loose. Make sure that handrails are as long as the stairways. ?Check any carpeting to make sure that it is  firmly attached to the stairs. Fix any carpet that is loose or worn. ?Avoid having throw rugs at the top or bottom of the stairs. If you do have throw rugs, attach them to the floor with carpet tape. ?Make sure that you have a light switch at the top of the stairs and the bottom of the stairs. If you do not have them, ask someone to add them for you. ?What else can I do to help prevent falls? ?Wear shoes that: ?Do not have high heels. ?Have rubber bottoms. ?Are comfortable and fit you well. ?Are closed at the toe. Do not wear sandals. ?If you use a stepladder: ?Make sure that it is fully opened. Do not climb a closed stepladder. ?Make sure that both sides of the stepladder are locked into place. ?Ask someone to hold it for  you, if possible. ?Clearly mark and make sure that you can see: ?Any grab bars or handrails. ?First and last steps. ?Where the edge of each step is. ?Use tools that help you move around (mobility aids) if they

## 2021-12-05 NOTE — Progress Notes (Signed)
Virtual Visit via Telephone Note ? ?I connected with  Madison King on 12/05/21 at 11:00 AM EDT by telephone and verified that I am speaking with the correct person using two identifiers. ? ?Medicare Annual Wellness visit completed telephonically due to Covid-19 pandemic.  ? ?Persons participating in this call: This Health Coach and this patient.  ? ?Location: ?Patient: Home ?Provider: Office  ?  ?I discussed the limitations, risks, security and privacy concerns of performing an evaluation and management service by telephone and the availability of in person appointments. The patient expressed understanding and agreed to proceed. ? ?Unable to perform video visit due to video visit attempted and failed and/or patient does not have video capability.  ? ?Some vital signs may be absent or patient reported.  ? ?Marzella Schlein, LPN ? ? ?Subjective:  ? Madison King is a 76 y.o. female who presents for an Initial Medicare Annual Wellness Visit. ? ?Review of Systems    ? ?Cardiac Risk Factors include: advanced age (>34men, >52 women);dyslipidemia;hypertension ? ?   ?Objective:  ?  ?There were no vitals filed for this visit. ?There is no height or weight on file to calculate BMI. ? ? ?  12/05/2021  ? 10:57 AM  ?Advanced Directives  ?Does Patient Have a Medical Advance Directive? Yes  ?Type of Advance Directive Healthcare Power of Attorney  ?Copy of Healthcare Power of Attorney in Chart? No - copy requested  ? ? ?Current Medications (verified) ?Outpatient Encounter Medications as of 12/05/2021  ?Medication Sig  ? ALPRAZolam (XANAX) 0.25 MG tablet TAKE 1 TABLET BY MOUTH AT BEDTIME AS NEEDED FOR ANXIETY.  ? Ascorbic Acid (VITAMIN C) 500 MG CAPS Take by mouth.  ? Cholecalciferol (VITAMIN D3) 50 MCG (2000 UT) TABS Take by mouth.  ? nebivolol (BYSTOLIC) 10 MG tablet Take 1 tablet (10 mg total) by mouth daily.  ? omeprazole (PRILOSEC) 20 MG capsule Take 20 mg by mouth.  ? pravastatin (PRAVACHOL) 10 MG tablet Take 1 tablet (10 mg total)  by mouth daily.  ? ?No facility-administered encounter medications on file as of 12/05/2021.  ? ? ?Allergies (verified) ?Aspirin, Latex, and Shellfish allergy  ? ?History: ?Past Medical History:  ?Diagnosis Date  ? Anxiety   ? GERD (gastroesophageal reflux disease)   ? Hyperlipidemia   ? ?Past Surgical History:  ?Procedure Laterality Date  ? LITHOTRIPSY    ? ?Family History  ?Problem Relation Age of Onset  ? Prostate cancer Father   ? Heart disease Father   ? Arthritis Mother   ? Hearing loss Mother   ? Cancer Brother   ? Arthritis Maternal Grandmother   ? Miscarriages / Stillbirths Maternal Grandmother   ? Heart disease Maternal Grandfather   ? Cancer Paternal Grandfather   ? Colon cancer Neg Hx   ? ?Social History  ? ?Socioeconomic History  ? Marital status: Divorced  ?  Spouse name: Not on file  ? Number of children: Not on file  ? Years of education: Not on file  ? Highest education level: Not on file  ?Occupational History  ? Not on file  ?Tobacco Use  ? Smoking status: Never  ? Smokeless tobacco: Not on file  ?Substance and Sexual Activity  ? Alcohol use: Never  ? Drug use: Never  ? Sexual activity: Not on file  ?Other Topics Concern  ? Not on file  ?Social History Narrative  ? Not on file  ? ?Social Determinants of Health  ? ?Financial Resource Strain: Low Risk   ?  Difficulty of Paying Living Expenses: Not hard at all  ?Food Insecurity: No Food Insecurity  ? Worried About Programme researcher, broadcasting/film/video in the Last Year: Never true  ? Ran Out of Food in the Last Year: Never true  ?Transportation Needs: No Transportation Needs  ? Lack of Transportation (Medical): No  ? Lack of Transportation (Non-Medical): No  ?Physical Activity: Sufficiently Active  ? Days of Exercise per Week: 3 days  ? Minutes of Exercise per Session: 90 min  ?Stress: No Stress Concern Present  ? Feeling of Stress : Not at all  ?Social Connections: Moderately Isolated  ? Frequency of Communication with Friends and Family: More than three times a week  ?  Frequency of Social Gatherings with Friends and Family: More than three times a week  ? Attends Religious Services: Never  ? Active Member of Clubs or Organizations: Yes  ? Attends Banker Meetings: 1 to 4 times per year  ? Marital Status: Divorced  ? ? ?Tobacco Counseling ?Counseling given: Not Answered ? ? ?Clinical Intake: ? ?Pre-visit preparation completed: Yes ? ?Pain : No/denies pain ? ?  ? ?BMI - recorded: 26.34 ?Nutritional Status: BMI 25 -29 Overweight ?Nutritional Risks: None ?Diabetes: No ? ?How often do you need to have someone help you when you read instructions, pamphlets, or other written materials from your doctor or pharmacy?: 1 - Never ? ?Diabetic?no ? ?Interpreter Needed?: No ? ?Information entered by :: Lanier Ensign, LPN ? ? ?Activities of Daily Living ? ?  12/05/2021  ? 10:59 AM  ?In your present state of health, do you have any difficulty performing the following activities:  ?Hearing? 0  ?Vision? 0  ?Difficulty concentrating or making decisions? 0  ?Walking or climbing stairs? 0  ?Dressing or bathing? 0  ?Doing errands, shopping? 0  ?Preparing Food and eating ? N  ?Using the Toilet? N  ?In the past six months, have you accidently leaked urine? N  ?Do you have problems with loss of bowel control? N  ?Managing your Medications? N  ?Managing your Finances? N  ?Housekeeping or managing your Housekeeping? N  ? ? ?Patient Care Team: ?Ardith Dark, MD as PCP - General (Family Medicine) ? ?Indicate any recent Medical Services you may have received from other than [facility] providers in the past year (date may be approximate). ? ?   ?Assessment:  ? This is a routine wellness examination for [facility]. ? ?Hearing/Vision screen ?Hearing Screening - Comments:: Pt denies any hearing issues  ?Vision Screening - Comments:: Encouraged to follow up with Provider  ? ?Dietary issues and exercise activities discussed: ?Current Exercise Habits: Home exercise routine, Type of exercise: walking;Other - see  comments, Time (Minutes): 60, Frequency (Times/Week): 3, Weekly Exercise (Minutes/Week): 180 ? ? Goals Addressed   ? ?  ?  ?  ?  ? This Visit's Progress  ?  Patient Stated     ?  None at this time  ?  ? ?  ? ?Depression Screen ? ?  12/05/2021  ? 10:56 AM 10/08/2020  ? 11:26 AM  ?PHQ 2/9 Scores  ?PHQ - 2 Score 0 0  ?  ?Fall Risk ? ?  12/05/2021  ? 10:58 AM  ?Fall Risk   ?Falls in the past year? 0  ?Number falls in past yr: 0  ?Injury with Fall? 0  ?Risk for fall due to : Impaired vision  ?Follow up Falls prevention discussed  ? ? ?FALL RISK PREVENTION PERTAINING TO THE  HOME: ? ?Any stairs in or around the home? No  ?If so, are there any without handrails? No  ?Home free of loose throw rugs in walkways, pet beds, electrical cords, etc? Yes  ?Adequate lighting in your home to reduce risk of falls? Yes  ? ?ASSISTIVE DEVICES UTILIZED TO PREVENT FALLS: ? ?Life alert? No  ?Use of a cane, walker or w/c? No  ?Grab bars in the bathroom? Yes  ?Shower chair or bench in shower? Yes  ?Elevated toilet seat or a handicapped toilet? No  ? ?TIMED UP AND GO: ? ?Was the test performed? No .  ? ?Cognitive Function: ?  ?  ? ?  12/05/2021  ? 11:01 AM  ?6CIT Screen  ?What Year? 0 points  ?What month? 0 points  ?What time? 0 points  ?Count back from 20 0 points  ?Months in reverse 0 points  ?Repeat phrase 0 points  ?Total Score 0 points  ? ? ?Immunizations ?Immunization History  ?Administered Date(s) Administered  ? Influenza, High Dose Seasonal PF 05/24/2021  ? Influenza-Unspecified 06/04/2020  ? PFIZER(Purple Top)SARS-COV-2 Vaccination 11/18/2019, 12/10/2019, 07/07/2020, 06/10/2021  ? Pneumococcal Conjugate-13 10/05/2015  ? Pneumococcal Polysaccharide-23 12/25/2016  ? Zoster Recombinat (Shingrix) 07/21/2019, 10/25/2020  ? ? ?TDAP status: Due, Education has been provided regarding the importance of this vaccine. Advised may receive this vaccine at local pharmacy or Health Dept. Aware to provide a copy of the vaccination record if obtained from  local pharmacy or Health Dept. Verbalized acceptance and understanding. ? ?Flu Vaccine status: Up to date ? ?Pneumococcal vaccine status: Up to date ? ?Covid-19 vaccine status: Completed vaccines ? ?Alger MemosQualifi

## 2021-12-26 ENCOUNTER — Ambulatory Visit (INDEPENDENT_AMBULATORY_CARE_PROVIDER_SITE_OTHER): Payer: Medicare HMO | Admitting: Family Medicine

## 2021-12-26 ENCOUNTER — Encounter: Payer: Self-pay | Admitting: Family Medicine

## 2021-12-26 VITALS — BP 147/64 | HR 53 | Temp 98.5°F | Ht 62.0 in | Wt 144.6 lb

## 2021-12-26 DIAGNOSIS — M542 Cervicalgia: Secondary | ICD-10-CM

## 2021-12-26 DIAGNOSIS — G8929 Other chronic pain: Secondary | ICD-10-CM | POA: Diagnosis not present

## 2021-12-26 DIAGNOSIS — I10 Essential (primary) hypertension: Secondary | ICD-10-CM | POA: Diagnosis not present

## 2021-12-26 NOTE — Progress Notes (Signed)
? ?  Madison King is a 76 y.o. female who presents today for an office visit. ? ?Assessment/Plan:  ?Chronic Problems Addressed Today: ?Chronic neck pain ?No red flags.  Symptoms are not controlled.  She has had acupuncture in the past and has done well with this however insurance would not pay for this.  She would like to have a trial of physical therapy.  Think this would be a good idea.  We will place referral today. ? ?Essential hypertension ?At goal per JNC 8 on Bystolic 10 mg daily. ? ?  ?Subjective:  ?HPI: ? ?See A/p for status of chronic conditions.   ? ?   ?  ?Objective:  ?Physical Exam: ?BP (!) 147/64   Pulse (!) 53   Temp 98.5 ?F (36.9 ?C) (Temporal)   Ht 5\' 2"  (1.575 m)   Wt 144 lb 9.6 oz (65.6 kg)   SpO2 99%   BMI 26.45 kg/m?   ?Gen: No acute distress, resting comfortably ?Neuro: Grossly normal, moves all extremities ?Psych: Normal affect and thought content ? ?   ? ?Algis Greenhouse. Jerline Pain, MD ?12/26/2021 1:21 PM  ?

## 2021-12-26 NOTE — Assessment & Plan Note (Signed)
No red flags.  Symptoms are not controlled.  She has had acupuncture in the past and has done well with this however insurance would not pay for this.  She would like to have a trial of physical therapy.  Think this would be a good idea.  We will place referral today. ?

## 2021-12-26 NOTE — Assessment & Plan Note (Signed)
At goal per JNC 8 on Bystolic 10 mg daily. ?

## 2021-12-26 NOTE — Patient Instructions (Signed)
It was very nice to see you today! ? ?I will refer you to see a physical therapist in our office.  ? ?Please let us know if we can do anything else to help. ? ?Take care, ?Dr Jerline Pain ? ?PLEASE NOTE: ? ?If you had any lab tests please let us know if you have not heard back within a few days. You may see your results on mychart before we have a chance to review them but we will give you a call once they are reviewed by [place]. If we ordered any referrals today, please let us know if you have not heard from their office within the next week.  ? ?Please try these tips to maintain a healthy lifestyle: ? ?Eat at least 3 REAL meals and 1-2 snacks per day.  Aim for no more than 5 hours between eating.  If you eat breakfast, please do so within one hour of getting up.  ? ?Each meal should contain half fruits/vegetables, one quarter protein, and one quarter carbs (no bigger than a computer mouse) ? ?Cut down on sweet beverages. This includes juice, soda, and sweet tea.  ? ?Drink at least 1 glass of water with each meal and aim for at least 8 glasses per day ? ?Exercise at least 150 minutes every week.   ?

## 2022-01-02 ENCOUNTER — Telehealth: Payer: Self-pay | Admitting: Family Medicine

## 2022-01-02 ENCOUNTER — Ambulatory Visit: Payer: Medicare HMO | Admitting: Physical Therapy

## 2022-01-02 DIAGNOSIS — M542 Cervicalgia: Secondary | ICD-10-CM | POA: Diagnosis not present

## 2022-01-02 DIAGNOSIS — M62838 Other muscle spasm: Secondary | ICD-10-CM

## 2022-01-02 NOTE — Telephone Encounter (Signed)
Pt received it a Tdap at [facility] on [address] on 12/26/21. She would like her chart to be updated ?

## 2022-01-02 NOTE — Telephone Encounter (Signed)
Updated immunization records
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
## 2022-01-09 ENCOUNTER — Ambulatory Visit: Payer: Medicare HMO | Admitting: Physical Therapy

## 2022-01-09 ENCOUNTER — Encounter: Payer: Self-pay | Admitting: Physical Therapy

## 2022-01-09 DIAGNOSIS — M542 Cervicalgia: Secondary | ICD-10-CM | POA: Diagnosis not present

## 2022-01-09 DIAGNOSIS — M62838 Other muscle spasm: Secondary | ICD-10-CM

## 2022-01-09 NOTE — Therapy (Signed)
?OUTPATIENT PHYSICAL THERAPY TREATMENT  ? ? ?Patient Name: Madison King ?MRN: [id number] ?DOB:01/15/1946, 76 y.o., female ?Today's Date: 01/09/2022 ? ? PT End of Session - 01/09/22 1426   ? ? Visit Number 2   ? Number of Visits 16   ? Date for PT Re-Evaluation 02/27/22   ? Authorization Type [employer]   ? PT Start Time 1430   ? PT Stop Time 1510   ? PT Time Calculation (min) 40 min   ? Activity Tolerance Patient tolerated treatment well   ? Behavior During Therapy [facility] for tasks assessed/performed   ? ?  ?  ? ?  ? ? ?Past Medical History:  ?Diagnosis Date  ? Anxiety   ? GERD (gastroesophageal reflux disease)   ? Hyperlipidemia   ? ?Past Surgical History:  ?Procedure Laterality Date  ? LITHOTRIPSY    ? ?Patient Active Problem List  ? Diagnosis Date Noted  ? Osteopenia - Last DEXA 2022 03/10/2021  ? Chronic neck pain 12/27/2020  ? Anxiety 10/08/2020  ? Vitamin D deficiency 10/08/2020  ? Essential hypertension 10/08/2020  ? GERD (gastroesophageal reflux disease) 10/08/2020  ? Dyslipidemia 10/08/2020  ? ? ?PCP: [name] ? ?REFERRING PROVIDER: [name] ? ?REFERRING DIAG: Neck Pain ? ?THERAPY DIAG:  ?Cervicalgia ? ?Other muscle spasm ? ? ?ONSET DATE:  ? ?SUBJECTIVE:                                                                                                                                                                                     ? ?SUBJECTIVE STATEMENT: ? ?Pt states pain in R more than L UT today. States increased pain in low back for a couple days, after eval. Now doing better. She is very hesitant to lay supine, due to previous back pain about 4 yrs ago.   ? ?PERTINENT HISTORY: ?Chronic sinusitis, root canal, Facial pain, ? ?PAIN:  ?Are you having pain? Yes: NPRS scale: 8/10 ?Pain location: bil neck pain into UT s  ?Pain description: Soreness, tightness ?Aggravating factors: reading, doing art work, driving.  ?Relieving factors: Heat ? ?PRECAUTIONS: None ? ?WEIGHT BEARING RESTRICTIONS No ? ?FALLS:  ?Has  patient fallen in last 6 months? No ? ? ?PLOF: Independent ? ?PATIENT GOALS     ? ?OBJECTIVE:  ? ?COGNITION: ? Overall cognitive status: Within functional limits for tasks assessed ?    ?SENSATION: ?WFL,  ? ? ?UPPER EXTREMITY ROM:  ?  Cervical: WFL ; Shoulder AROM: mild limitation for full flexion and abd/pain. PROM: mild limitation for flexion. Some pain at end range ER ? ?UPPER EXTREMITY MMT: ?  Shoulder: 4/5, mild pain with ER testing.  ? ?PALPATION:  ?  Tightness, trigger points in bil UT s, tenderness in levator, paraspinals, and SO. ?  ?TODAY'S TREATMENT:  ?01/09/2022 ? ?Therapeutic Exercise: ?Aerobic: ?Supine: ?S/L: L shoulder ER 1lb x 15;  ?Seated: Scap squeeze x 10; Shoulder rolls x 10; Pulley(for L shoulder ROM) flexion x 10; Education and practice for achieving optimal seated/upright posture; Seated pelvic tilts x 10;  ?Standing: Wall slides x 10 on L (for shoulder ROM);  ?Stretches: UT and levator stretch 30 sec x 2 bil;  ?Neuromuscular Re-education: ?Manual Therapy: DTM/TPR/ and tennis ball  to R UT and levator ?Self Care: ? ? ?PATIENT EDUCATION: ?Education details: Posture, HEP, benefits of DN,  ?Person educated: Patient ?Education method: Explanation, Demonstration, Tactile cues, Verbal cues, and Handouts ?Education comprehension: verbalized understanding, returned demonstration, verbal cues required, tactile cues required, and needs further education ? ? ?HOME EXERCISE PROGRAM: ?Access Code: [email or web address] ? ? ?ASSESSMENT: ? ?CLINICAL IMPRESSION: ?Pt with much muscle tension and soreness in R UT and levator today, with active trigger points. Discussed benefits of DN in future. Pt hesitant for laying supine today (for back pain), so less manual for c-spine able to be done. Did do ROM for L shoulder in seated/standing positions, pt with good ability for this, will continue postural strengthening as able. Pt with improved seated posture after education and practice today.   ? ? ?OBJECTIVE IMPAIRMENTS decreased  activity tolerance, decreased knowledge of use of DME, decreased mobility, decreased ROM, decreased strength, increased muscle spasms, impaired flexibility, impaired UE functional use, improper body mechanics, and pain.  ? ?ACTIVITY LIMITATIONS cleaning, community activity, driving, meal prep, laundry, yard work, and shopping.  ? ?PERSONAL FACTORS Time since onset of injury/illness/exacerbation are also affecting patient's functional outcome.  ? ? ?REHAB POTENTIAL: Good ? ?CLINICAL DECISION MAKING: Stable/uncomplicated ? ?EVALUATION COMPLEXITY: Low ? ? ?GOALS: ?Goals reviewed with patient? Yes ? ?SHORT TERM GOALS: Target date: 01/16/22 ? ?Pt to be independent with initial HEP ? ?Goal status: INITIAL ? ?2.  Pt to demo decreased pain, trigger points, and muscle soreness by at least 50 %.  ? ?Goal status: INITIAL ? ? ? ?LONG TERM GOALS: Target date: 02/27/22 - 8 Weeks.  ? ?Pt to be independent with final HEP ? ?Goal status: INITIAL ? ?2.  Pt to demo cervical ROM to be WNL/pain free, to improve ability for driving and ADLs.  ? ?Goal status: INITIAL ? ?3.  Pt to report decreased pain in neck and shoulders to 0-2/10 with activity.  ? ?Goal status: INITIAL ? ?4.  Pt to demo ability to self correct posture in clinic, at least 75 % of time, and will report optimal posture with sitting, reading, and art work at home, for improved pain.  ? ?Goal status: INITIAL ? ? ? ? ?PLAN: ?PT FREQUENCY: 2x/week ? ?PT DURATION: 8 weeks ? ?PLANNED INTERVENTIONS: Therapeutic exercises, Therapeutic activity, Neuromuscular re-education, Patient/Family education, Joint manipulation, Joint mobilization, DME instructions, Dry Needling, Electrical stimulation, Spinal manipulation, Spinal mobilization, Cryotherapy, Moist heat, Taping, Traction, Ultrasound, Ionotophoresis 4mg /ml Dexamethasone, and Manual therapy ? ?PLAN FOR NEXT SESSION: Manual for muscle tension, DN , Scapular awareness and strengthening.  ? ? ? , PT, DPT ?3:42 PM   01/09/22 ? ? ?

## 2022-01-11 ENCOUNTER — Ambulatory Visit: Payer: Medicare HMO | Admitting: Physical Therapy

## 2022-01-11 ENCOUNTER — Encounter: Payer: Self-pay | Admitting: Physical Therapy

## 2022-01-11 DIAGNOSIS — M62838 Other muscle spasm: Secondary | ICD-10-CM

## 2022-01-11 DIAGNOSIS — M542 Cervicalgia: Secondary | ICD-10-CM | POA: Diagnosis not present

## 2022-01-11 NOTE — Therapy (Signed)
?OUTPATIENT PHYSICAL THERAPY TREATMENT  ? ? ?Patient Name: Madison King ?MRN: [id number] ?DOB:04-Jan-1946, 76 y.o., female ?Today's Date: 01/11/2022 ? ? PT End of Session - 01/11/22 1302   ? ? Visit Number 3   ? Number of Visits 16   ? Date for PT Re-Evaluation 02/27/22   ? Authorization Type [employer]   ? PT Start Time 1304   ? PT Stop Time 1345   ? PT Time Calculation (min) 41 min   ? Activity Tolerance Patient tolerated treatment well   ? Behavior During Therapy [facility] for tasks assessed/performed   ? ?  ?  ? ?  ? ? ?Past Medical History:  ?Diagnosis Date  ? Anxiety   ? GERD (gastroesophageal reflux disease)   ? Hyperlipidemia   ? ?Past Surgical History:  ?Procedure Laterality Date  ? LITHOTRIPSY    ? ?Patient Active Problem List  ? Diagnosis Date Noted  ? Osteopenia - Last DEXA 2022 03/10/2021  ? Chronic neck pain 12/27/2020  ? Anxiety 10/08/2020  ? Vitamin D deficiency 10/08/2020  ? Essential hypertension 10/08/2020  ? GERD (gastroesophageal reflux disease) 10/08/2020  ? Dyslipidemia 10/08/2020  ? ? ?PCP: [name] ? ?REFERRING PROVIDER: [name] ? ?REFERRING DIAG: Neck Pain ? ?THERAPY DIAG:  ?Cervicalgia ? ?Other muscle spasm ? ? ?ONSET DATE:  ? ?SUBJECTIVE:                                                                                                                                                                                     ? ?SUBJECTIVE STATEMENT: ?Pt states less pain in neck today. Has been doing HEP ? ? ?PERTINENT HISTORY: ?Chronic sinusitis, root canal, Facial pain, ? ?PAIN:  ?Are you having pain? Yes: NPRS scale: 8/10 ?Pain location: bil neck pain into UT s  ?Pain description: Soreness, tightness ?Aggravating factors: reading, doing art work, driving.  ?Relieving factors: Heat ? ?PRECAUTIONS: None ? ?WEIGHT BEARING RESTRICTIONS No ? ?FALLS:  ?Has patient fallen in last 6 months? No ? ? ?PLOF: Independent ? ?PATIENT GOALS     ? ?OBJECTIVE:  ? ?COGNITION: ? Overall cognitive status: Within  functional limits for tasks assessed ?    ?SENSATION: ?WFL,  ? ? ?UPPER EXTREMITY ROM:  ?  Cervical: WFL ; Shoulder AROM: mild limitation for full flexion and abd/pain. PROM: mild limitation for flexion. Some pain at end range ER ? ?UPPER EXTREMITY MMT: ?  Shoulder: 4/5, mild pain with ER testing.  ? ?PALPATION:  ?Tightness, trigger points in bil UT s, tenderness in levator, paraspinals, and SO. ?  ?TODAY'S TREATMENT:  ?01/11/2022 ? ?Therapeutic Exercise: ?Aerobic: ?Supine: ?S/L:  ?Seated:  Seated pelvic tilts  x 10;  ?Standing: Wall slides x 10 on L ; Shoulder ER YTB x 20;  ?Stretches: UT and levator stretch 30 sec x 2 bil;  Standing shoulder flexion at rail x 2 min; Seated shoulder ER butterfly x 10;  ?Neuromuscular Re-education: ?Manual Therapy: STM/DTM  to  bil UT and levator ?Self Care:  ? ? ?PATIENT EDUCATION: ?Education details: Posture, HEP, Reviewed use of heat (with pts heating pad)  ?Person educated: Patient ?Education method: Explanation, Demonstration, Tactile cues, Verbal cues, and Handouts ?Education comprehension: verbalized understanding, returned demonstration, verbal cues required, tactile cues required, and needs further education ? ? ?HOME EXERCISE PROGRAM: ?Access Code: [email or web address] ? ? ?ASSESSMENT: ? ?CLINICAL IMPRESSION: ?Pt with decreased soreness in bil UT s today, does have some tenderness with palpation, but much less pain with active cervical ROM today. Continued with ther ex for postural strength and shoulder ROM, will benefit from progressive strengthening and posture education as tolerated.  ? ? ?OBJECTIVE IMPAIRMENTS decreased activity tolerance, decreased knowledge of use of DME, decreased mobility, decreased ROM, decreased strength, increased muscle spasms, impaired flexibility, impaired UE functional use, improper body mechanics, and pain.  ? ?ACTIVITY LIMITATIONS cleaning, community activity, driving, meal prep, laundry, yard work, and shopping.  ? ?PERSONAL FACTORS Time since onset of  injury/illness/exacerbation are also affecting patient's functional outcome.  ? ? ?REHAB POTENTIAL: Good ? ?CLINICAL DECISION MAKING: Stable/uncomplicated ? ?EVALUATION COMPLEXITY: Low ? ? ?GOALS: ?Goals reviewed with patient? Yes ? ?SHORT TERM GOALS: Target date: 01/16/22 ? ?Pt to be independent with initial HEP ? ?Goal status: INITIAL ? ?2.  Pt to demo decreased pain, trigger points, and muscle soreness by at least 50 %.  ? ?Goal status: INITIAL ? ? ? ?LONG TERM GOALS: Target date: 02/27/22 - 8 Weeks.  ? ?Pt to be independent with final HEP ? ?Goal status: INITIAL ? ?2.  Pt to demo cervical ROM to be WNL/pain free, to improve ability for driving and ADLs.  ? ?Goal status: INITIAL ? ?3.  Pt to report decreased pain in neck and shoulders to 0-2/10 with activity.  ? ?Goal status: INITIAL ? ?4.  Pt to demo ability to self correct posture in clinic, at least 75 % of time, and will report optimal posture with sitting, reading, and art work at home, for improved pain.  ? ?Goal status: INITIAL ? ? ? ? ?PLAN: ?PT FREQUENCY: 2x/week ? ?PT DURATION: 8 weeks ? ?PLANNED INTERVENTIONS: Therapeutic exercises, Therapeutic activity, Neuromuscular re-education, Patient/Family education, Joint manipulation, Joint mobilization, DME instructions, Dry Needling, Electrical stimulation, Spinal manipulation, Spinal mobilization, Cryotherapy, Moist heat, Taping, Traction, Ultrasound, Ionotophoresis 4mg /ml Dexamethasone, and Manual therapy ? ?PLAN FOR NEXT SESSION: Manual for muscle tension, DN , Scapular awareness and strengthening.  ? ? ? , PT, DPT ?1:53 PM  01/11/22 ? ? ?

## 2022-01-12 ENCOUNTER — Encounter: Payer: Medicare HMO | Admitting: Physical Therapy

## 2022-01-18 ENCOUNTER — Ambulatory Visit: Payer: Medicare HMO | Admitting: Physical Therapy

## 2022-01-18 DIAGNOSIS — M542 Cervicalgia: Secondary | ICD-10-CM

## 2022-01-18 DIAGNOSIS — M62838 Other muscle spasm: Secondary | ICD-10-CM

## 2022-01-18 NOTE — Therapy (Signed)
OUTPATIENT PHYSICAL THERAPY TREATMENT    Patient Name: Madison King MRN: [id number] DOB:July 06, 1946, 76 y.o., female Today's Date: 01/19/2022   PT End of Session - 01/19/22 1328     Visit Number 4    Number of Visits 16    Date for PT Re-Evaluation 02/27/22    Authorization Type [employer]    PT Start Time 1218    PT Stop Time [id number]    PT Time Calculation (min) 40 min    Activity Tolerance Patient tolerated treatment well    Behavior During Therapy [facility] for tasks assessed/performed              Past Medical History:  Diagnosis Date   Anxiety    GERD (gastroesophageal reflux disease)    Hyperlipidemia    Past Surgical History:  Procedure Laterality Date   LITHOTRIPSY     Patient Active Problem List   Diagnosis Date Noted   Osteopenia - Last DEXA 2022 03/10/2021   Chronic neck pain 12/27/2020   Anxiety 10/08/2020   Vitamin D deficiency 10/08/2020   Essential hypertension 10/08/2020   GERD (gastroesophageal reflux disease) 10/08/2020   Dyslipidemia 10/08/2020    PCP: Dimas Chyle  REFERRING PROVIDER: Dimas Chyle  REFERRING DIAG: Neck Pain  THERAPY DIAG:  Cervicalgia  Other muscle spasm   ONSET DATE:   SUBJECTIVE:                                                                                                                                                                                      SUBJECTIVE STATEMENT:  Pt states less pain in neck overall. Still has some tension daily. Has been doing HEP   PERTINENT HISTORY: Chronic sinusitis, root canal, Facial pain,  PAIN:  Are you having pain? Yes: NPRS scale: 4/10 Pain location: bil neck pain into UT s  Pain description: Soreness, tightness Aggravating factors: reading, doing art work, driving.  Relieving factors: Heat  PRECAUTIONS: None  WEIGHT BEARING RESTRICTIONS No  FALLS:  Has patient fallen in last 6 months? No   PLOF: Independent  PATIENT GOALS      OBJECTIVE:    COGNITION:  Overall cognitive status: Within functional limits for tasks assessed     SENSATION: WFL,    UPPER EXTREMITY ROM:    Cervical: WFL ; Shoulder AROM: mild limitation for full flexion and abd/pain. PROM: mild limitation for flexion. Some pain at end range ER  UPPER EXTREMITY MMT:   Shoulder: 4/5, mild pain with ER testing.   PALPATION:  Tightness, trigger points in bil UT s, tenderness in levator, paraspinals, and SO.   TODAY'S TREATMENT:  01/19/2022  Therapeutic Exercise: Aerobic: Supine:  lawnchair position: shoulder flexion with cane x 10;  S/L:  Seated:  Seated pelvic tilts x 10;  Standing: Wall slides x 10 on L ; Shoulder ER YTB x 20;  Stretches: UT and levator stretch 30 sec x 2 bil;  Standing shoulder flexion at rail x 2 min; Seated shoulder ER butterfly x 10;  Neuromuscular Re-education: Manual Therapy: STM/DTM  to R UT and levator Trigger Point Dry-Needling  Treatment instructions: Expect mild to moderate muscle soreness. S/S of pneumothorax if dry needled over a lung field, and to seek immediate medical attention should they occur. Patient verbalized understanding of these instructions and education.  Patient Consent Given: Yes Education handout provided: Yes Muscles treated: R UT Electrical stimulation performed: No Parameters: N/A Treatment response/outcome: twitch response elicited, palpable increase in muscle length.     PATIENT EDUCATION: Education details: Posture, reviewd HEP, Person educated: Patient Education method: Explanation, Demonstration, Tactile cues, Verbal cues, and Handouts Education comprehension: verbalized understanding, returned demonstration, verbal cues required, tactile cues required, and needs further education   HOME EXERCISE PROGRAM: Access Code: [email or web address]   ASSESSMENT:  CLINICAL IMPRESSION: Pt with much tightness in R UT today. Pt agrees to DN, good twitch response in UT. Discussed continuing stretching and  postural strength for HEP. Will assess effects of DN next visit. Reviewed different positions for shoulder flexion and ER stretching, pt reluctant to lay supine due to back pain. Will benefit from continued care for neck and shoulder pain.     OBJECTIVE IMPAIRMENTS decreased activity tolerance, decreased knowledge of use of DME, decreased mobility, decreased ROM, decreased strength, increased muscle spasms, impaired flexibility, impaired UE functional use, improper body mechanics, and pain.   ACTIVITY LIMITATIONS cleaning, community activity, driving, meal prep, laundry, yard work, and shopping.   PERSONAL FACTORS Time since onset of injury/illness/exacerbation are also affecting patient's functional outcome.    REHAB POTENTIAL: Good  CLINICAL DECISION MAKING: Stable/uncomplicated  EVALUATION COMPLEXITY: Low   GOALS: Goals reviewed with patient? Yes  SHORT TERM GOALS: Target date: 01/16/22  Pt to be independent with initial HEP  Goal status: INITIAL  2.  Pt to demo decreased pain, trigger points, and muscle soreness by at least 50 %.   Goal status: INITIAL    LONG TERM GOALS: Target date: 02/27/22 - 8 Weeks.   Pt to be independent with final HEP  Goal status: INITIAL  2.  Pt to demo cervical ROM to be WNL/pain free, to improve ability for driving and ADLs.   Goal status: INITIAL  3.  Pt to report decreased pain in neck and shoulders to 0-2/10 with activity.   Goal status: INITIAL  4.  Pt to demo ability to self correct posture in clinic, at least 75 % of time, and will report optimal posture with sitting, reading, and art work at home, for improved pain.   Goal status: INITIAL     PLAN: PT FREQUENCY: 2x/week  PT DURATION: 8 weeks  PLANNED INTERVENTIONS: Therapeutic exercises, Therapeutic activity, Neuromuscular re-education, Patient/Family education, Joint manipulation, Joint mobilization, DME instructions, Dry Needling, Electrical stimulation, Spinal  manipulation, Spinal mobilization, Cryotherapy, Moist heat, Taping, Traction, Ultrasound, Ionotophoresis 4mg /ml Dexamethasone, and Manual therapy  PLAN FOR NEXT SESSION: Manual for muscle tension, DN , Scapular awareness and strengthening.    Lyndee Hensen, PT, DPT 1:28 PM  01/19/22

## 2022-01-19 ENCOUNTER — Encounter: Payer: Self-pay | Admitting: Physical Therapy

## 2022-01-20 ENCOUNTER — Ambulatory Visit: Payer: Medicare HMO | Admitting: Physical Therapy

## 2022-01-20 ENCOUNTER — Encounter: Payer: Self-pay | Admitting: Physical Therapy

## 2022-01-20 ENCOUNTER — Encounter: Payer: Medicare HMO | Admitting: Physical Therapy

## 2022-01-20 DIAGNOSIS — M62838 Other muscle spasm: Secondary | ICD-10-CM

## 2022-01-20 DIAGNOSIS — M542 Cervicalgia: Secondary | ICD-10-CM

## 2022-01-20 NOTE — Therapy (Signed)
OUTPATIENT PHYSICAL THERAPY TREATMENT    Patient Name: Madison King MRN: [id number] DOB:1945-10-21, 76 y.o., female Today's Date: 01/20/2022   PT End of Session - 01/20/22 1332     Visit Number 5    Number of Visits 16    Date for PT Re-Evaluation 02/27/22    Authorization Type [employer]    PT Start Time 1019    PT Stop Time 1100    PT Time Calculation (min) 41 min    Activity Tolerance Patient tolerated treatment well    Behavior During Therapy WFL for tasks assessed/performed              Past Medical History:  Diagnosis Date   Anxiety    GERD (gastroesophageal reflux disease)    Hyperlipidemia    Past Surgical History:  Procedure Laterality Date   LITHOTRIPSY     Patient Active Problem List   Diagnosis Date Noted   Osteopenia - Last DEXA 2022 03/10/2021   Chronic neck pain 12/27/2020   Anxiety 10/08/2020   Vitamin D deficiency 10/08/2020   Essential hypertension 10/08/2020   GERD (gastroesophageal reflux disease) 10/08/2020   Dyslipidemia 10/08/2020    PCP: [name]  REFERRING PROVIDER: [name]  REFERRING DIAG: Neck Pain  THERAPY DIAG:  Cervicalgia  Other muscle spasm   ONSET DATE:   SUBJECTIVE:                                                                                                                                                                                      SUBJECTIVE STATEMENT:  Still having pain in R side of neck, better in UT, but pain up higher .    PERTINENT HISTORY: Chronic sinusitis, root canal, Facial pain,  PAIN:  Are you having pain? Yes: NPRS scale: 4/10 Pain location: bil neck pain into UT s  Pain description: Soreness, tightness Aggravating factors: reading, doing art work, driving.  Relieving factors: Heat  PRECAUTIONS: None  WEIGHT BEARING RESTRICTIONS No  FALLS:  Has patient fallen in last 6 months? No   PLOF: Independent  PATIENT GOALS      OBJECTIVE:   COGNITION:  Overall cognitive  status: Within functional limits for tasks assessed     SENSATION: WFL,    UPPER EXTREMITY ROM:    Cervical: WFL ; Shoulder AROM: mild limitation for full flexion and abd/pain. PROM: mild limitation for flexion. Some pain at end range ER  UPPER EXTREMITY MMT:   Shoulder: 4/5, mild pain with ER testing.   PALPATION:  Tightness, trigger points in bil UT s, tenderness in levator, paraspinals, and SO.   TODAY'S TREATMENT:  01/20/2022  Therapeutic Exercise: Aerobic:  Supine:  S/L:  Seated:  Seated pelvic tilts x 10;  Standing: Wall slides x 10 on L; circles cw, ccw x 10 ea;  Shoulder ER YTB x 20; scap squeezes x 15;  Stretches: Seated SO stretch x 2 min;   Neuromuscular Re-education: Manual Therapy: STM/DTM  to R cervical paraspinals, UT, levator, SOR, manual distraction, Cervical PA mobs, Manual UT and levator stretching;  Trigger Point Dry-Needling     PATIENT EDUCATION: Education details: Posture, reviewd HEP, Person educated: Patient Education method: Explanation, Demonstration, Tactile cues, Verbal cues, and Handouts Education comprehension: verbalized understanding, returned demonstration, verbal cues required, tactile cues required, and needs further education   HOME EXERCISE PROGRAM: Access Code: [email or web address]   ASSESSMENT:  CLINICAL IMPRESSION: Tightness in R UT improved today. She has pain in mid/upper R cervical region with rotation and palpation. She has tenderness in R SO, and paraspinals today. More manual performed today for pain, pt able to lay more in supine position to allow for manual therapy.    OBJECTIVE IMPAIRMENTS decreased activity tolerance, decreased knowledge of use of DME, decreased mobility, decreased ROM, decreased strength, increased muscle spasms, impaired flexibility, impaired UE functional use, improper body mechanics, and pain.   ACTIVITY LIMITATIONS cleaning, community activity, driving, meal prep, laundry, yard work, and shopping.   PERSONAL  FACTORS Time since onset of injury/illness/exacerbation are also affecting patient's functional outcome.    REHAB POTENTIAL: Good  CLINICAL DECISION MAKING: Stable/uncomplicated  EVALUATION COMPLEXITY: Low   GOALS: Goals reviewed with patient? Yes  SHORT TERM GOALS: Target date: 01/16/22  Pt to be independent with initial HEP  Goal status: INITIAL  2.  Pt to demo decreased pain, trigger points, and muscle soreness by at least 50 %.   Goal status: INITIAL    LONG TERM GOALS: Target date: 02/27/22 - 8 Weeks.   Pt to be independent with final HEP  Goal status: INITIAL  2.  Pt to demo cervical ROM to be WNL/pain free, to improve ability for driving and ADLs.   Goal status: INITIAL  3.  Pt to report decreased pain in neck and shoulders to 0-2/10 with activity.   Goal status: INITIAL  4.  Pt to demo ability to self correct posture in clinic, at least 75 % of time, and will report optimal posture with sitting, reading, and art work at home, for improved pain.   Goal status: INITIAL     PLAN: PT FREQUENCY: 2x/week  PT DURATION: 8 weeks  PLANNED INTERVENTIONS: Therapeutic exercises, Therapeutic activity, Neuromuscular re-education, Patient/Family education, Joint manipulation, Joint mobilization, DME instructions, Dry Needling, Electrical stimulation, Spinal manipulation, Spinal mobilization, Cryotherapy, Moist heat, Taping, Traction, Ultrasound, Ionotophoresis 4mg /ml Dexamethasone, and Manual therapy  PLAN FOR NEXT SESSION: Manual for muscle tension, DN , Scapular awareness and strengthening.    , PT, DPT 1:34 PM  01/20/22

## 2022-02-01 ENCOUNTER — Ambulatory Visit: Payer: Medicare HMO | Admitting: Physical Therapy

## 2022-02-01 DIAGNOSIS — M542 Cervicalgia: Secondary | ICD-10-CM | POA: Diagnosis not present

## 2022-02-01 DIAGNOSIS — M62838 Other muscle spasm: Secondary | ICD-10-CM | POA: Diagnosis not present

## 2022-02-01 NOTE — Therapy (Signed)
OUTPATIENT PHYSICAL THERAPY TREATMENT    Patient Name: Madison King MRN: [id number] DOB:1946/09/01, 76 y.o., female Today's Date: 02/01/22   PT End of Session - 02/05/22 1701     Visit Number 6    Number of Visits 16    Date for PT Re-Evaluation 02/27/22    Authorization Type [employer]    PT Start Time 1305    PT Stop Time 1344    PT Time Calculation (min) 39 min    Activity Tolerance Patient tolerated treatment well    Behavior During Therapy [facility] for tasks assessed/performed               Past Medical History:  Diagnosis Date   Anxiety    GERD (gastroesophageal reflux disease)    Hyperlipidemia    Past Surgical History:  Procedure Laterality Date   LITHOTRIPSY     Patient Active Problem List   Diagnosis Date Noted   Osteopenia - Last DEXA 2022 03/10/2021   Chronic neck pain 12/27/2020   Anxiety 10/08/2020   Vitamin D deficiency 10/08/2020   Essential hypertension 10/08/2020   GERD (gastroesophageal reflux disease) 10/08/2020   Dyslipidemia 10/08/2020    PCP: [name]  REFERRING PROVIDER: [name]  REFERRING DIAG: Neck Pain  THERAPY DIAG:  Cervicalgia  Other muscle spasm   ONSET DATE:   SUBJECTIVE:                                                                                                                                                                                      SUBJECTIVE STATEMENT:  Still having pain in R side of neck, mostly with sitting and reading. Has been doing HEP.    PERTINENT HISTORY: Chronic sinusitis, root canal, Facial pain,  PAIN:  Are you having pain? Yes: NPRS scale: 6/10 Pain location: bil neck pain into UT s  Pain description: Soreness, tightness Aggravating factors: reading, doing art work, driving.  Relieving factors: Heat  PRECAUTIONS: None  WEIGHT BEARING RESTRICTIONS No  FALLS:  Has patient fallen in last 6 months? No   PLOF: Independent  PATIENT GOALS      OBJECTIVE:    COGNITION:  Overall cognitive status: Within functional limits for tasks assessed     SENSATION: WFL,    UPPER EXTREMITY ROM:    Cervical: WFL ; Shoulder AROM: mild limitation for full flexion and abd/pain. PROM: mild limitation for flexion. Some pain at end range ER  UPPER EXTREMITY MMT:   Shoulder: 4/5, mild pain with ER testing.   PALPATION:  Tightness, trigger points in bil UT s, tenderness in levator, paraspinals, and SO.   TODAY'S TREATMENT:   02/01/22 Therapeutic  Exercise: Aerobic: Supine: (1/2 reclined) Bil shoulder flexion x10 S/L:  Seated:   Standing: Wall slides x 10 on L; circles cw, ccw x 10 ea;  Shoulder ER YTB x 20; scap squeezes x 15;  Stretches: Seated UT stretch Neuromuscular Re-education: Manual Therapy: STM/DTM  to R cervical paraspinals, UT, levator, SOR, manual distraction, Cervical PA mobs, Manual UT and levator stretching;  Trigger Point Dry-Needling     PATIENT EDUCATION: Education details: Posture, reviewd HEP, Person educated: Patient Education method: Explanation, Demonstration, Tactile cues, Verbal cues, and Handouts Education comprehension: verbalized understanding, returned demonstration, verbal cues required, tactile cues required, and needs further education   HOME EXERCISE PROGRAM: Access Code: [id number]   ASSESSMENT:  CLINICAL IMPRESSION: Pt with continued tightness in R UT region and mid c-spine. Good tolerance for manual therapy, in 1/2 reclined position today. Pt to benefit from dry needling to UT and neck, but requests to do next visit. Pt reports feeling "better" overall, but still reports pain daily, and tightness.    OBJECTIVE IMPAIRMENTS decreased activity tolerance, decreased knowledge of use of DME, decreased mobility, decreased ROM, decreased strength, increased muscle spasms, impaired flexibility, impaired UE functional use, improper body mechanics, and pain.   ACTIVITY LIMITATIONS cleaning, community activity,  driving, meal prep, laundry, yard work, and shopping.   PERSONAL FACTORS Time since onset of injury/illness/exacerbation are also affecting patient's functional outcome.    REHAB POTENTIAL: Good  CLINICAL DECISION MAKING: Stable/uncomplicated  EVALUATION COMPLEXITY: Low   GOALS: Goals reviewed with patient? Yes  SHORT TERM GOALS: Target date: 01/16/22  Pt to be independent with initial HEP  Goal status: INITIAL  2.  Pt to demo decreased pain, trigger points, and muscle soreness by at least 50 %.   Goal status: INITIAL    LONG TERM GOALS: Target date: 02/27/22 - 8 Weeks.   Pt to be independent with final HEP  Goal status: INITIAL  2.  Pt to demo cervical ROM to be WNL/pain free, to improve ability for driving and ADLs.   Goal status: INITIAL  3.  Pt to report decreased pain in neck and shoulders to 0-2/10 with activity.   Goal status: INITIAL  4.  Pt to demo ability to self correct posture in clinic, at least 75 % of time, and will report optimal posture with sitting, reading, and art work at home, for improved pain.   Goal status: INITIAL     PLAN: PT FREQUENCY: 2x/week  PT DURATION: 8 weeks  PLANNED INTERVENTIONS: Therapeutic exercises, Therapeutic activity, Neuromuscular re-education, Patient/Family education, Joint manipulation, Joint mobilization, DME instructions, Dry Needling, Electrical stimulation, Spinal manipulation, Spinal mobilization, Cryotherapy, Moist heat, Taping, Traction, Ultrasound, Ionotophoresis 4mg /ml Dexamethasone, and Manual therapy  PLAN FOR NEXT SESSION: Manual for muscle tension, DN , Scapular awareness and strengthening.    Lyndee Hensen, PT, DPT 8:20 PM  02/05/22

## 2022-02-05 ENCOUNTER — Encounter: Payer: Self-pay | Admitting: Physical Therapy

## 2022-02-07 ENCOUNTER — Encounter: Payer: Self-pay | Admitting: Physical Therapy

## 2022-02-07 ENCOUNTER — Ambulatory Visit: Payer: Medicare HMO | Admitting: Physical Therapy

## 2022-02-07 ENCOUNTER — Other Ambulatory Visit: Payer: Self-pay | Admitting: Family Medicine

## 2022-02-07 DIAGNOSIS — M62838 Other muscle spasm: Secondary | ICD-10-CM

## 2022-02-07 DIAGNOSIS — M542 Cervicalgia: Secondary | ICD-10-CM | POA: Diagnosis not present

## 2022-02-07 NOTE — Therapy (Signed)
OUTPATIENT PHYSICAL THERAPY TREATMENT    Patient Name: Madison King MRN: [id number] DOB:[birth year], 76 y.o., female Today's Date: 02/07/22   PT End of Session - 02/07/22 2130     Visit Number 7    Number of Visits 16    Date for PT Re-Evaluation 02/27/22    Authorization Type [employer]    PT Start Time 1305    PT Stop Time 1344    PT Time Calculation (min) 39 min    Activity Tolerance Patient tolerated treatment well    Behavior During Therapy [facility] for tasks assessed/performed                Past Medical History:  Diagnosis Date   Anxiety    GERD (gastroesophageal reflux disease)    Hyperlipidemia    Past Surgical History:  Procedure Laterality Date   LITHOTRIPSY     Patient Active Problem List   Diagnosis Date Noted   Osteopenia - Last DEXA 2022 03/10/2021   Chronic neck pain 12/27/2020   Anxiety 10/08/2020   Vitamin D deficiency 10/08/2020   Essential hypertension 10/08/2020   GERD (gastroesophageal reflux disease) 10/08/2020   Dyslipidemia 10/08/2020    PCP: [name]  REFERRING PROVIDER: [name]  REFERRING DIAG: Neck Pain  THERAPY DIAG:  Cervicalgia  Other muscle spasm   ONSET DATE:   SUBJECTIVE:                                                                                                                                                                                      SUBJECTIVE STATEMENT:  Still having pain in R side of neck. Pain is better after sessions, but then she feels soreness again a day or so later.   PERTINENT HISTORY: Chronic sinusitis, root canal, Facial pain,  PAIN:  Are you having pain? Yes: NPRS scale: 6/10 Pain location: bil neck pain into UT s  Pain description: Soreness, tightness Aggravating factors: reading, doing art work, driving.  Relieving factors: Heat  PRECAUTIONS: None  WEIGHT BEARING RESTRICTIONS No  FALLS:  Has patient fallen in last 6 months? No   PLOF: Independent  PATIENT GOALS       OBJECTIVE:   COGNITION:  Overall cognitive status: Within functional limits for tasks assessed     SENSATION: WFL,    UPPER EXTREMITY ROM:    Cervical: WFL ; Shoulder AROM: mild limitation for full flexion and abd/pain. PROM: mild limitation for flexion. Some pain at end range ER  UPPER EXTREMITY MMT:   Shoulder: 4/5, mild pain with ER testing.   PALPATION:  Tightness, trigger points in bil UT s, tenderness in levator, paraspinals, and SO.  TODAY'S TREATMENT:   02/07/22 Therapeutic Exercise: Aerobic:  Supine: (1/2 reclined) Bil shoulder flexion x10 S/L:  Seated:   Standing: Wall slides x 10 bil; Shoulder ER YTB x 20; scap squeezes x 15;  Stretches: supine(1/2 reclined)  shoulder ER butterfly x 20;  Neuromuscular Re-education: Manual Therapy: STM/DTM  to R cervical paraspinals, UT, levator, SOR, manual distraction, Cervical PA mobs, Manual UT and levator stretching;  Trigger Point Dry-Needling     PATIENT EDUCATION: Education details: Posture, reviewd HEP, Person educated: Patient Education method: Explanation, Demonstration, Tactile cues, Verbal cues, and Handouts Education comprehension: verbalized understanding, returned demonstration, verbal cues required, tactile cues required, and needs further education   HOME EXERCISE PROGRAM: Access Code: [email or web address]   ASSESSMENT:  CLINICAL IMPRESSION: Pt declines dry needling today. She continues to have bothersome pain in R side of neck. Pt with decreased pain after sessions, but carryover has been limited. Discussed other pain relief, massage and acupuncture for long term management as needed. Also discussed referral to sports medicine if pain does not improve.    OBJECTIVE IMPAIRMENTS decreased activity tolerance, decreased knowledge of use of DME, decreased mobility, decreased ROM, decreased strength, increased muscle spasms, impaired flexibility, impaired UE functional use, improper body mechanics, and pain.    ACTIVITY LIMITATIONS cleaning, community activity, driving, meal prep, laundry, yard work, and shopping.   PERSONAL FACTORS Time since onset of injury/illness/exacerbation are also affecting patient's functional outcome.    REHAB POTENTIAL: Good  CLINICAL DECISION MAKING: Stable/uncomplicated  EVALUATION COMPLEXITY: Low   GOALS: Goals reviewed with patient? Yes  SHORT TERM GOALS: Target date: 01/16/22  Pt to be independent with initial HEP  Goal status: INITIAL  2.  Pt to demo decreased pain, trigger points, and muscle soreness by at least 50 %.   Goal status: INITIAL    LONG TERM GOALS: Target date: 02/27/22 - 8 Weeks.   Pt to be independent with final HEP  Goal status: INITIAL  2.  Pt to demo cervical ROM to be WNL/pain free, to improve ability for driving and ADLs.   Goal status: INITIAL  3.  Pt to report decreased pain in neck and shoulders to 0-2/10 with activity.   Goal status: INITIAL  4.  Pt to demo ability to self correct posture in clinic, at least 75 % of time, and will report optimal posture with sitting, reading, and art work at home, for improved pain.   Goal status: INITIAL     PLAN: PT FREQUENCY: 2x/week  PT DURATION: 8 weeks  PLANNED INTERVENTIONS: Therapeutic exercises, Therapeutic activity, Neuromuscular re-education, Patient/Family education, Joint manipulation, Joint mobilization, DME instructions, Dry Needling, Electrical stimulation, Spinal manipulation, Spinal mobilization, Cryotherapy, Moist heat, Taping, Traction, Ultrasound, Ionotophoresis 4mg /ml Dexamethasone, and Manual therapy  PLAN FOR NEXT SESSION: Manual for muscle tension, DN , Scapular awareness and strengthening.    , PT, DPT 9:31 PM  02/07/22

## 2022-02-09 ENCOUNTER — Ambulatory Visit (INDEPENDENT_AMBULATORY_CARE_PROVIDER_SITE_OTHER): Payer: Medicare HMO | Admitting: Physical Therapy

## 2022-02-09 ENCOUNTER — Encounter: Payer: Self-pay | Admitting: Physical Therapy

## 2022-02-09 DIAGNOSIS — M62838 Other muscle spasm: Secondary | ICD-10-CM | POA: Diagnosis not present

## 2022-02-09 DIAGNOSIS — M542 Cervicalgia: Secondary | ICD-10-CM

## 2022-02-09 NOTE — Therapy (Signed)
OUTPATIENT PHYSICAL THERAPY TREATMENT    Patient Name: Madison King MRN: [id number] DOB:1946-08-08, 76 y.o., female Today's Date: 02/09/22   PT End of Session - 02/09/22 1306     Visit Number 8    Number of Visits 16    Date for PT Re-Evaluation 02/27/22    Authorization Type [employer]    PT Start Time 1305    PT Stop Time 1345    PT Time Calculation (min) 40 min    Activity Tolerance Patient tolerated treatment well    Behavior During Therapy [facility] for tasks assessed/performed                Past Medical History:  Diagnosis Date   Anxiety    GERD (gastroesophageal reflux disease)    Hyperlipidemia    Past Surgical History:  Procedure Laterality Date   LITHOTRIPSY     Patient Active Problem List   Diagnosis Date Noted   Osteopenia - Last DEXA 2022 03/10/2021   Chronic neck pain 12/27/2020   Anxiety 10/08/2020   Vitamin D deficiency 10/08/2020   Essential hypertension 10/08/2020   GERD (gastroesophageal reflux disease) 10/08/2020   Dyslipidemia 10/08/2020    PCP: [name]  REFERRING PROVIDER: [name]  REFERRING DIAG: Neck Pain  THERAPY DIAG:  Cervicalgia  Other muscle spasm   ONSET DATE:   SUBJECTIVE:                                                                                                                                                                                      SUBJECTIVE STATEMENT:  Still having pain in R side of neck, with tightness   PERTINENT HISTORY: Chronic sinusitis, root canal, Facial pain,  PAIN:  Are you having pain? Yes: NPRS scale: 6/10 Pain location: bil neck pain into UT s  Pain description: Soreness, tightness Aggravating factors: reading, doing art work, driving.  Relieving factors: Heat  PRECAUTIONS: None  WEIGHT BEARING RESTRICTIONS No  FALLS:  Has patient fallen in last 6 months? No   PLOF: Independent  PATIENT GOALS      OBJECTIVE:   COGNITION:  Overall cognitive status: Within  functional limits for tasks assessed     SENSATION: WFL,    UPPER EXTREMITY ROM:    Cervical: WFL ; Shoulder AROM: mild limitation for full flexion and abd/pain. PROM: mild limitation for flexion. Some pain at end range ER  UPPER EXTREMITY MMT:   Shoulder: 4/5, mild pain with ER testing.   PALPATION:  Tightness, trigger points in bil UT s, tenderness in levator, paraspinals, and SO.   TODAY'S TREATMENT:   02/09/22 Therapeutic Exercise: Aerobic:  Supine: (1/2 reclined) Bil  shoulder flexion x10 S/L:  Seated:   Standing: Wall slides x 10 bil; Shoulder ER YTB x 20; scap squeezes RTB x 15;  Stretches: supine(1/2 reclined)  shoulder ER butterfly x 20;  Neuromuscular Re-education: Manual Therapy: STM/DTM  to R cervical paraspinals, UT, levator, manual distraction, Cervical PA mobs, Manual UT and levator stretching;    PATIENT EDUCATION: Education details: Posture, reviewd HEP, Person educated: Patient Education method: Explanation, Demonstration, Tactile cues, Verbal cues, and Handouts Education comprehension: verbalized understanding, returned demonstration, verbal cues required, tactile cues required, and needs further education   HOME EXERCISE PROGRAM: Access Code: [email or web address]   ASSESSMENT:  CLINICAL IMPRESSION: Pt given information for massage, acupuncture and sports med Dr. Discussed seeing MD for further look at neck pain. Pt has had ongoing pain for years. She is getting relief from PT sessions with manual and muscle tension relief, but pain relief short lived. Will benefit from continued care, likley d/c in 2 weeks if not improving.     OBJECTIVE IMPAIRMENTS decreased activity tolerance, decreased knowledge of use of DME, decreased mobility, decreased ROM, decreased strength, increased muscle spasms, impaired flexibility, impaired UE functional use, improper body mechanics, and pain.   ACTIVITY LIMITATIONS cleaning, community activity, driving, meal prep, laundry, yard  work, and shopping.   PERSONAL FACTORS Time since onset of injury/illness/exacerbation are also affecting patient's functional outcome.    REHAB POTENTIAL: Good  CLINICAL DECISION MAKING: Stable/uncomplicated  EVALUATION COMPLEXITY: Low   GOALS: Goals reviewed with patient? Yes  SHORT TERM GOALS: Target date: 01/16/22  Pt to be independent with initial HEP  Goal status: INITIAL  2.  Pt to demo decreased pain, trigger points, and muscle soreness by at least 50 %.   Goal status: INITIAL    LONG TERM GOALS: Target date: 02/27/22 - 8 Weeks.   Pt to be independent with final HEP  Goal status: INITIAL  2.  Pt to demo cervical ROM to be WNL/pain free, to improve ability for driving and ADLs.   Goal status: INITIAL  3.  Pt to report decreased pain in neck and shoulders to 0-2/10 with activity.   Goal status: INITIAL  4.  Pt to demo ability to self correct posture in clinic, at least 75 % of time, and will report optimal posture with sitting, reading, and art work at home, for improved pain.   Goal status: INITIAL     PLAN: PT FREQUENCY: 2x/week  PT DURATION: 8 weeks  PLANNED INTERVENTIONS: Therapeutic exercises, Therapeutic activity, Neuromuscular re-education, Patient/Family education, Joint manipulation, Joint mobilization, DME instructions, Dry Needling, Electrical stimulation, Spinal manipulation, Spinal mobilization, Cryotherapy, Moist heat, Taping, Traction, Ultrasound, Ionotophoresis 4mg /ml Dexamethasone, and Manual therapy  PLAN FOR NEXT SESSION: Manual for muscle tension, DN , Scapular awareness and strengthening.    , PT, DPT 4:52 PM  02/09/22

## 2022-02-24 ENCOUNTER — Encounter: Payer: Self-pay | Admitting: Physical Therapy

## 2022-02-24 ENCOUNTER — Ambulatory Visit (INDEPENDENT_AMBULATORY_CARE_PROVIDER_SITE_OTHER): Payer: Medicare HMO | Admitting: Physical Therapy

## 2022-02-24 DIAGNOSIS — M62838 Other muscle spasm: Secondary | ICD-10-CM

## 2022-02-24 DIAGNOSIS — M542 Cervicalgia: Secondary | ICD-10-CM

## 2022-04-06 ENCOUNTER — Ambulatory Visit: Payer: Self-pay

## 2022-04-06 NOTE — Patient Outreach (Signed)
  Care Coordination   04/06/2022 Name: Madison King MRN: [id number] DOB: October 22, 1945   Care Coordination Outreach Attempts:  An unsuccessful telephone outreach was attempted today to offer the patient information about available care coordination services as a benefit of their health plan.   Follow Up Plan:  Additional outreach attempts will be made to offer the patient care coordination information and services.   Encounter Outcome:  No Answer  Care Coordination Interventions Activated:  No   Care Coordination Interventions:  No, not indicated    Bevelyn Ngo, BSW, CDP Social Worker, Certified Dementia Practitioner Care Coordination [phone]

## 2022-05-11 ENCOUNTER — Telehealth: Payer: Self-pay

## 2022-05-11 NOTE — Patient Outreach (Signed)
  Care Coordination   05/11/2022 Name: Madison King MRN: [id number] DOB: 08/31/1946   Care Coordination Outreach Attempts:  A second unsuccessful outreach was attempted today to offer the patient with information about available care coordination services as a benefit of their health plan.     Follow Up Plan:  Additional outreach attempts will be made to offer the patient care coordination information and services.   Encounter Outcome:  No Answer  Care Coordination Interventions Activated:  No   Care Coordination Interventions:  No, not indicated    Bevelyn Ngo, BSW, CDP Social Worker, Certified Dementia Practitioner Care Coordination [phone]

## 2022-06-06 DIAGNOSIS — H35033 Hypertensive retinopathy, bilateral: Secondary | ICD-10-CM | POA: Diagnosis not present

## 2022-06-06 DIAGNOSIS — Z0100 Encounter for examination of eyes and vision without abnormal findings: Secondary | ICD-10-CM | POA: Diagnosis not present

## 2022-06-06 DIAGNOSIS — H524 Presbyopia: Secondary | ICD-10-CM | POA: Diagnosis not present

## 2022-06-19 ENCOUNTER — Ambulatory Visit: Payer: Self-pay

## 2022-06-19 NOTE — Patient Outreach (Signed)
  Care Coordination   06/19/2022 Name: Madison King MRN: [id number] DOB: Oct 08, 1945   Care Coordination Outreach Attempts:  A third unsuccessful outreach was attempted today to offer the patient with information about available care coordination services as a benefit of their health plan.   Follow Up Plan:  No further outreach attempts will be made at this time. We have been unable to contact the patient to offer or enroll patient in care coordination services  Encounter Outcome:  No Answer  Care Coordination Interventions Activated:  No   Care Coordination Interventions:  No, not indicated    Daneen Schick, BSW, CDP Social Worker, Certified Dementia Practitioner [place] Coordination [phone]

## 2022-08-11 ENCOUNTER — Other Ambulatory Visit: Payer: Self-pay | Admitting: Family Medicine

## 2022-08-14 ENCOUNTER — Other Ambulatory Visit: Payer: Self-pay | Admitting: *Deleted

## 2022-08-14 MED ORDER — ALPRAZOLAM 0.25 MG PO TABS: ORAL_TABLET | ORAL | 1 refills | Status: DC

## 2022-09-08 ENCOUNTER — Other Ambulatory Visit: Payer: Self-pay | Admitting: Family Medicine

## 2022-10-23 ENCOUNTER — Other Ambulatory Visit: Payer: Self-pay | Admitting: Family Medicine

## 2022-10-23 NOTE — Telephone Encounter (Signed)
Last refill: 08/14/22 #30,1 Last OV: 12/26/21 dx. Chronic neck pain, no scheduled appts

## 2022-10-31 ENCOUNTER — Other Ambulatory Visit: Payer: Self-pay | Admitting: Family Medicine

## 2022-10-31 ENCOUNTER — Encounter: Payer: Self-pay | Admitting: Family Medicine

## 2022-10-31 DIAGNOSIS — Z1231 Encounter for screening mammogram for malignant neoplasm of breast: Secondary | ICD-10-CM

## 2022-11-01 ENCOUNTER — Other Ambulatory Visit: Payer: Self-pay | Admitting: *Deleted

## 2022-11-01 NOTE — Telephone Encounter (Signed)
Ok to place referral.  Algis Greenhouse. Jerline Pain, MD 11/01/2022 9:09 AM

## 2022-11-03 ENCOUNTER — Ambulatory Visit (INDEPENDENT_AMBULATORY_CARE_PROVIDER_SITE_OTHER): Payer: Medicare HMO | Admitting: Family Medicine

## 2022-11-03 VITALS — BP 134/76 | HR 56 | Temp 97.4°F | Ht 62.0 in | Wt 143.4 lb

## 2022-11-03 DIAGNOSIS — I10 Essential (primary) hypertension: Secondary | ICD-10-CM | POA: Diagnosis not present

## 2022-11-03 DIAGNOSIS — E785 Hyperlipidemia, unspecified: Secondary | ICD-10-CM | POA: Diagnosis not present

## 2022-11-03 MED ORDER — TRIAMCINOLONE ACETONIDE 0.025 % EX OINT: 1.0000 | TOPICAL_OINTMENT | Freq: Two times a day (BID) | CUTANEOUS | 0 refills | Status: DC

## 2022-11-03 NOTE — Patient Instructions (Signed)
It was very nice to see you today!  You have a bit of eczema.  Please try the triamcinolone.  Let us know if not improving.  Please keep an eye on your blood pressure and let us know if persistently elevated.  Please come back soon for your annual physical with labs.   Take care, Dr Jerline Pain  PLEASE NOTE:  If you had any lab tests, please let us know if you have not heard back within a few days. You may see your results on mychart before we have a chance to review them but we will give you a call once they are reviewed by [place].   If we ordered any referrals today, please let us know if you have not heard from their office within the next week.   If you had any urgent prescriptions sent in today, please check with the pharmacy within an hour of our visit to make sure the prescription was transmitted appropriately.   Please try these tips to maintain a healthy lifestyle:  Eat at least 3 REAL meals and 1-2 snacks per day.  Aim for no more than 5 hours between eating.  If you eat breakfast, please do so within one hour of getting up.   Each meal should contain half fruits/vegetables, one quarter protein, and one quarter carbs (no bigger than a computer mouse)  Cut down on sweet beverages. This includes juice, soda, and sweet tea.   Drink at least 1 glass of water with each meal and aim for at least 8 glasses per day  Exercise at least 150 minutes every week.

## 2022-11-03 NOTE — Assessment & Plan Note (Signed)
Initially elevated however at goal on recheck.  She will continue to monitor at home.  Continue Bystolic 10 mg daily.

## 2022-11-03 NOTE — Assessment & Plan Note (Signed)
Well on pravastatin 10 mg daily.  She will come back soon for annual visit and we can recheck labs at that time.

## 2022-11-03 NOTE — Progress Notes (Signed)
   Galylea Vega is a 77 y.o. female who presents today for an office visit.  Assessment/Plan:  New/Acute Problems: Cheilitis  No red flag signs or symptoms.  Initially had improvement with bacitracin however symptoms have now returned.  Appearance consistent with eczematous rash.  Will try a low-dose topical triamcinolone twice daily.  If this is not improving would consider trial of antifungal.  Chronic Problems Addressed Today: Essential hypertension Initially elevated however at goal on recheck.  She will continue to monitor at home.  Continue Bystolic 10 mg daily.  Dyslipidemia Well on pravastatin 10 mg daily.  She will come back soon for annual visit and we can recheck labs at that time.     Subjective:  HPI:  See A/p for status of chronic conditions.    Her main concern today is facial rash.  Started a few weeks ago. Lips got really dry and had a lot of cracking. Rash mostly resolved but still had a but of a rash under her lower lip. Tried using vaseline and bacitracin which helped.  Symptoms have resolved for a few weeks however over the last week or so, symptoms seem to be returning. Some mild irritation to the area.  No drainage.  No purulence.       Objective:  Physical Exam: BP 134/76   Pulse (!) 56   Temp (!) 97.4 F (36.3 C) (Temporal)   Ht '5\' 2"'$  (1.575 m)   Wt 143 lb 6.4 oz (65 kg)   SpO2 98%   BMI 26.23 kg/m   Gen: No acute distress, resting comfortably HEENT: Eczematous patch noted just inferior to lower lip.  Bilateral corners of lips with scaling and fissures. CV: Regular rate and rhythm with no murmurs appreciated Pulm: Normal work of breathing, clear to auscultation bilaterally with no crackles, wheezes, or rhonchi Neuro: Grossly normal, moves all extremities Psych: Normal affect and thought content      Eileen Kangas M. Jerline Pain, MD 11/03/2022 12:08 PM

## 2022-11-10 ENCOUNTER — Telehealth: Payer: Self-pay

## 2022-11-10 NOTE — Patient Outreach (Signed)
  Care Coordination   11/10/2022 Name: Madison King MRN: [id number] DOB: Jan 28, 1946   Care Coordination Outreach Attempts:  An unsuccessful telephone outreach was attempted today to offer the patient information about available care coordination services as a benefit of their health plan.   Follow Up Plan:  Additional outreach attempts will be made to offer the patient care coordination information and services.   Encounter Outcome:  No Answer   Care Coordination Interventions:  No, not indicated    Daneen Schick, BSW, CDP Social Worker, Certified Dementia Practitioner [facility]  Care Coordination [phone]

## 2022-11-16 IMAGING — MG MM DIGITAL SCREENING BILAT W/ TOMO AND CAD
6 of 10 series · 6 of 30 positions shown · non-contrast
Comparison: Previous exam(s).

CLINICAL DATA: Screening.

EXAM:
DIGITAL SCREENING BILATERAL MAMMOGRAM WITH TOMOSYNTHESIS AND CAD
TECHNIQUE: Bilateral screening digital craniocaudal and mediolateral oblique
mammograms were obtained. Bilateral screening digital breast
tomosynthesis was performed. The images were evaluated with
computer-aided detection.

[L MLO synth-2D]
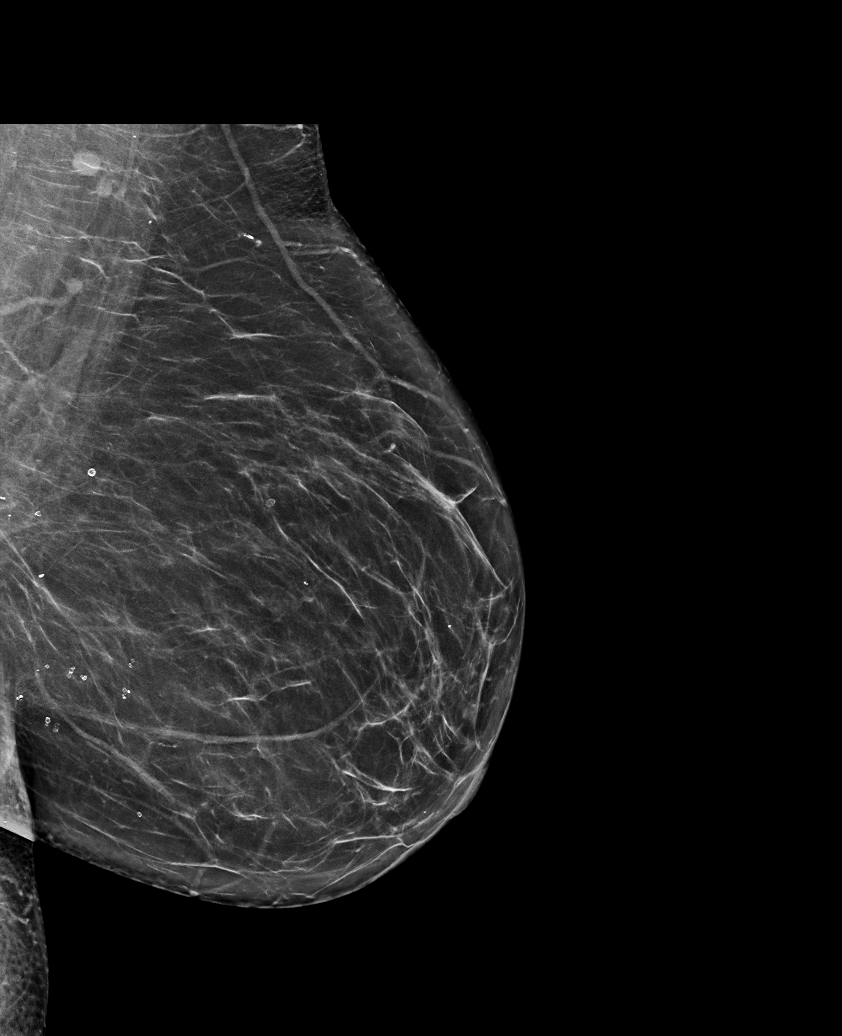

[R MLO synth-2D]
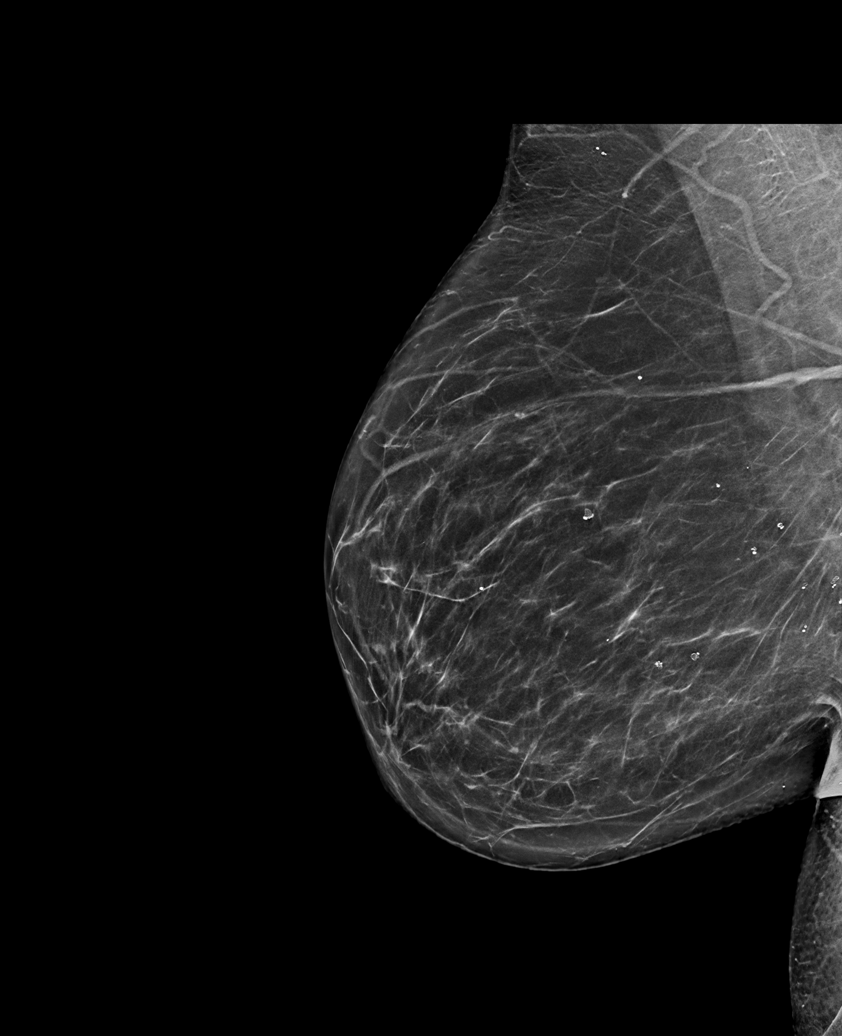

[L CC synth-2D]
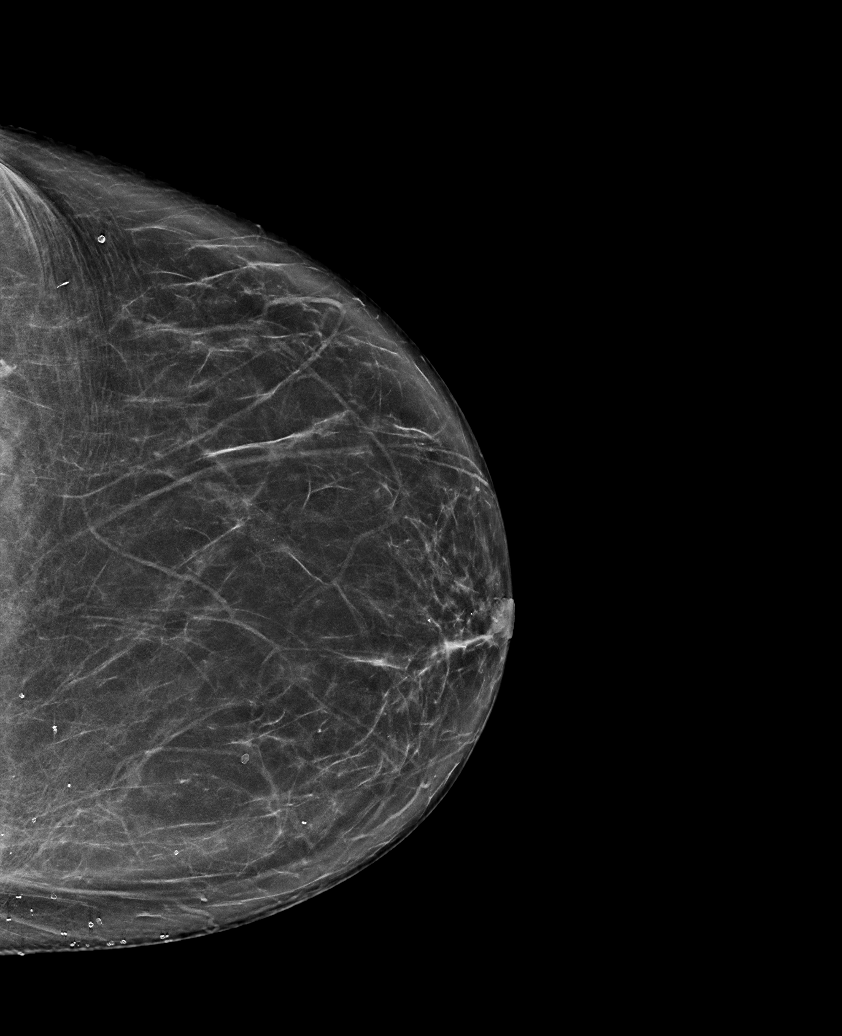

[R CC synth-2D (1 of 2)]
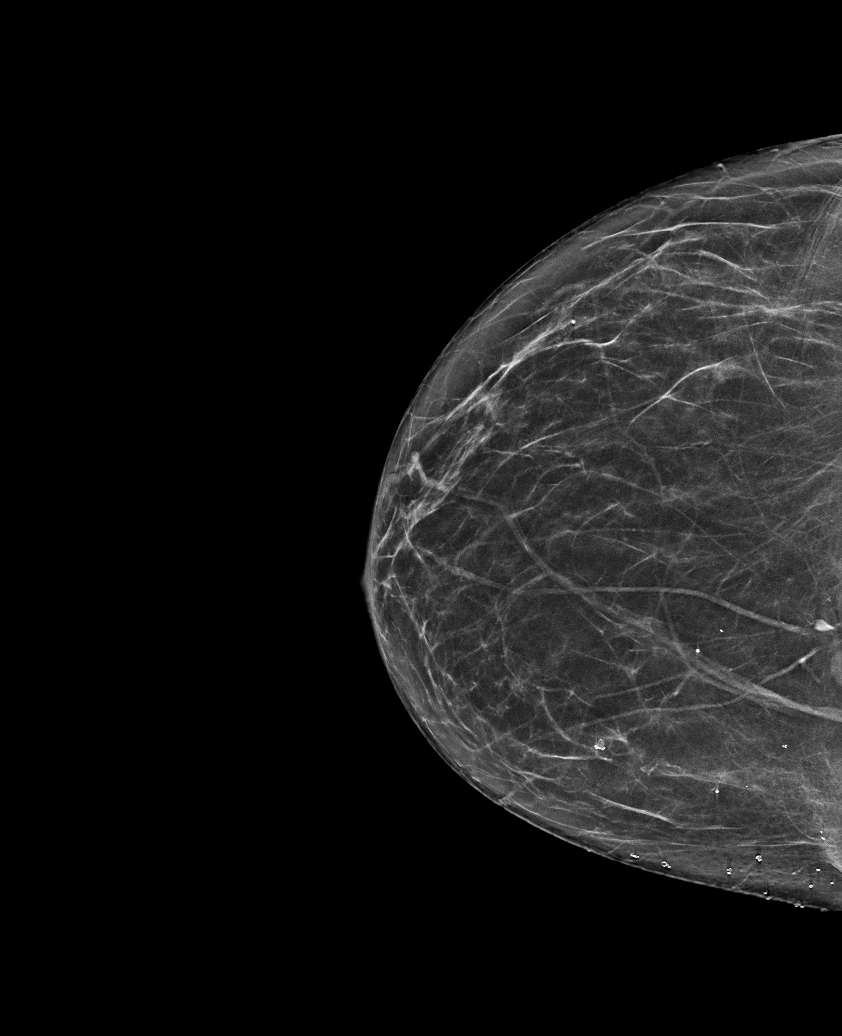

[R CC synth-2D (2 of 2)]
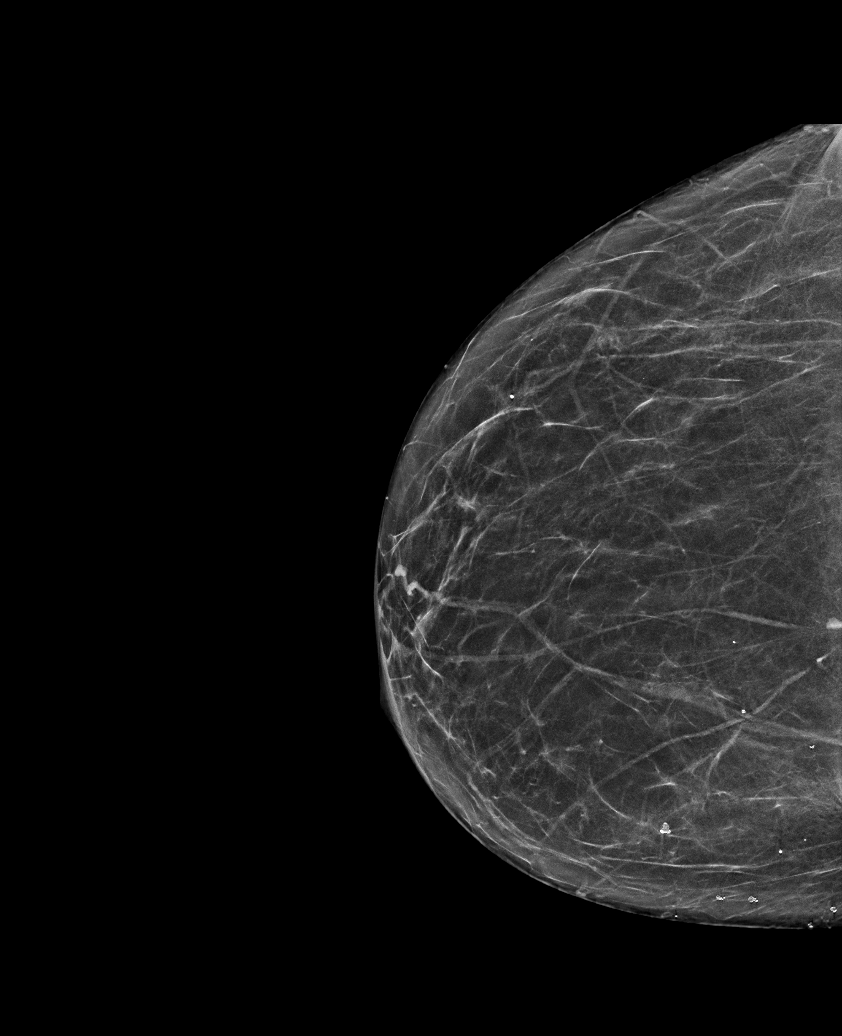

[L MLO tomo · tomo slice 37/73.0]
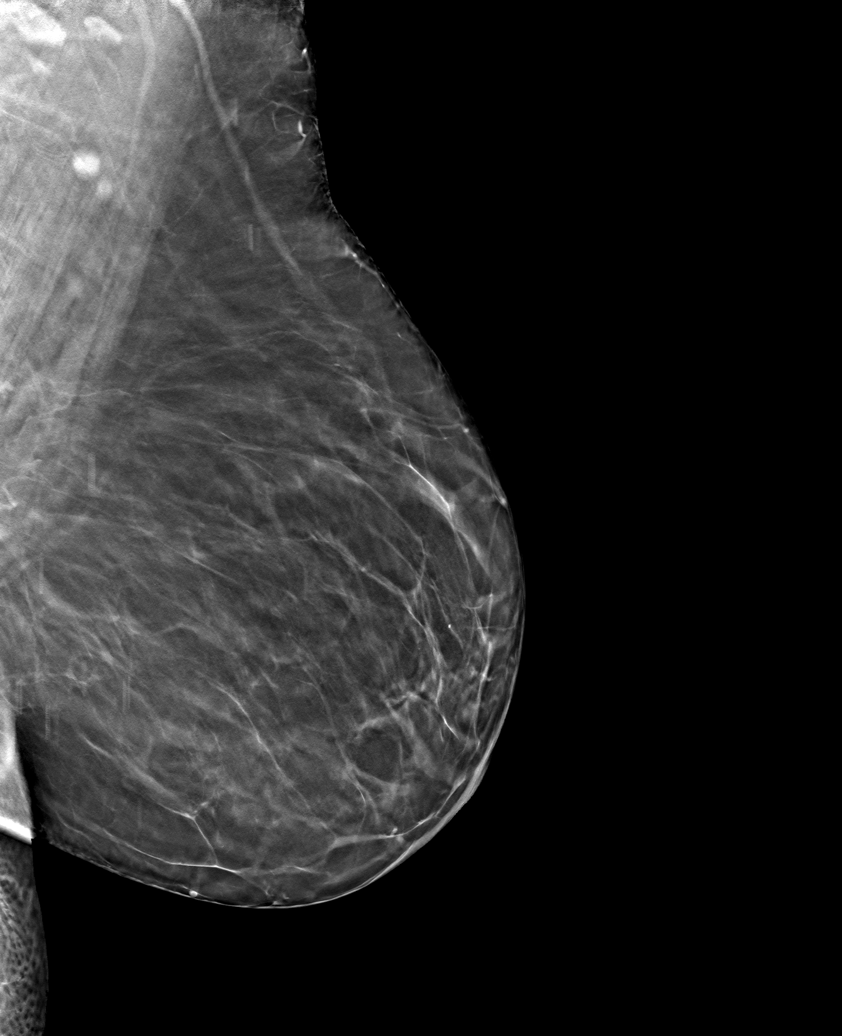

[6 of 30 positions shown; findings below may reference images not displayed]

ACR Breast Density Category b: There are scattered areas of
fibroglandular density.
FINDINGS: There are no findings suspicious for malignancy.
IMPRESSION: No mammographic evidence of malignancy. A result letter of this
screening mammogram will be mailed directly to the patient.

RECOMMENDATION:
Screening mammogram in one year. (Code:51-O-LD2)

BI-RADS CATEGORY  1: Negative.

## 2022-12-09 ENCOUNTER — Other Ambulatory Visit: Payer: Self-pay | Admitting: Family Medicine

## 2022-12-15 ENCOUNTER — Ambulatory Visit
Admission: RE | Admit: 2022-12-15 | Discharge: 2022-12-15 | Disposition: A | Discharge status: Home / Self Care | Payer: Medicare HMO | Source: Ambulatory Visit | Attending: Family Medicine | Admitting: Family Medicine

## 2022-12-15 DIAGNOSIS — Z1231 Encounter for screening mammogram for malignant neoplasm of breast: Secondary | ICD-10-CM

## 2022-12-18 ENCOUNTER — Ambulatory Visit (INDEPENDENT_AMBULATORY_CARE_PROVIDER_SITE_OTHER): Payer: Medicare HMO

## 2022-12-18 VITALS — Wt 143.0 lb

## 2022-12-18 DIAGNOSIS — Z0000 Encounter for general adult medical examination without abnormal findings: Secondary | ICD-10-CM | POA: Diagnosis not present

## 2022-12-18 NOTE — Progress Notes (Signed)
I connected with  Madison King on 12/18/22 by a audio enabled telemedicine application and verified that I am speaking with the correct person using two identifiers.  Patient Location: Home  Provider Location: Office/Clinic  I discussed the limitations of evaluation and management by telemedicine. The patient expressed understanding and agreed to proceed.   Patient Medicare AWV questionnaire was completed by the patient on 12/14/22; I have confirmed that all information answered by patient is correct and no changes since this date.      Subjective:   Madison King is a 77 y.o. female who presents for Medicare Annual (Subsequent) preventive examination.  Review of Systems     Cardiac Risk Factors include: advanced age (>93men, >52 women);dyslipidemia;hypertension     Objective:    Today's Vitals   12/18/22 1103  Weight: 143 lb (64.9 kg)   Body mass index is 26.16 kg/m.     12/18/2022   11:08 AM 12/05/2021   10:57 AM  Advanced Directives  Does Patient Have a Medical Advance Directive? Yes Yes  Type of Estate agent of [place];Living will Healthcare Power of Attorney  Copy of Healthcare Power of Attorney in Chart? No - copy requested No - copy requested    Current Medications (verified) Outpatient Encounter Medications as of 12/18/2022  Medication Sig   ALPRAZolam (XANAX) 0.25 MG tablet TAKE 1 TABLET BY MOUTH AT BEDTIME AS NEEDED FOR ANXIETY   AREXVY 120 MCG/0.5ML injection    Ascorbic Acid (VITAMIN C) 500 MG CAPS Take by mouth.   Cholecalciferol (VITAMIN D3) 50 MCG (2000 UT) TABS Take by mouth.   nebivolol (BYSTOLIC) 10 MG tablet Take 1 tablet (10 mg total) by mouth daily.   omeprazole (PRILOSEC) 20 MG capsule Take 20 mg by mouth.   pravastatin (PRAVACHOL) 10 MG tablet TAKE 1 TABLET BY MOUTH EVERY DAY   SPIKEVAX injection    triamcinolone (KENALOG) 0.025 % ointment Apply 1 Application topically 2 (two) times daily.   No facility-administered encounter  medications on file as of 12/18/2022.    Allergies (verified) Aspirin, Latex, and Shellfish allergy   History: Past Medical History:  Diagnosis Date   Anxiety    GERD (gastroesophageal reflux disease)    Hyperlipidemia    Past Surgical History:  Procedure Laterality Date   LITHOTRIPSY     Family History  Problem Relation Age of Onset   Prostate cancer Father    Heart disease Father    Arthritis Mother    Hearing loss Mother    Cancer Brother    Arthritis Maternal Grandmother    Miscarriages / Stillbirths Maternal Grandmother    Heart disease Maternal Grandfather    Cancer Paternal Grandfather    Colon cancer Neg Hx    Social History   Socioeconomic History   Marital status: Divorced    Spouse name: Not on file   Number of children: Not on file   Years of education: Not on file   Highest education level: Not on file  Occupational History   Not on file  Tobacco Use   Smoking status: Never   Smokeless tobacco: Not on file  Substance and Sexual Activity   Alcohol use: Never   Drug use: Never   Sexual activity: Not on file  Other Topics Concern   Not on file  Social History Narrative   Not on file   Social Determinants of Health   Financial Resource Strain: Low Risk  (12/14/2022)   Overall Financial Resource Strain (CARDIA)  Difficulty of Paying Living Expenses: Not hard at all  Food Insecurity: No Food Insecurity (12/14/2022)   Hunger Vital Sign    Worried About Running Out of Food in the Last Year: Never true    Ran Out of Food in the Last Year: Never true  Transportation Needs: No Transportation Needs (12/14/2022)   PRAPARE - Administrator, Civil Service (Medical): No    Lack of Transportation (Non-Medical): No  Physical Activity: Sufficiently Active (12/14/2022)   Exercise Vital Sign    Days of Exercise per Week: 3 days    Minutes of Exercise per Session: 90 min  Stress: No Stress Concern Present (12/14/2022)   [employer] of  Occupational Health - Occupational Stress Questionnaire    Feeling of Stress : Not at all  Social Connections: Moderately Integrated (12/14/2022)   Social Connection and Isolation Panel [NHANES]    Frequency of Communication with Friends and Family: More than three times a week    Frequency of Social Gatherings with Friends and Family: More than three times a week    Attends Religious Services: Never    Database administrator or Organizations: Yes    Attends Engineer, structural: 1 to 4 times per year    Marital Status: Living with partner    Tobacco Counseling Counseling given: Not Answered   Clinical Intake:  Pre-visit preparation completed: Yes  Pain : No/denies pain     BMI - recorded: 26.16 Nutritional Status: BMI 25 -29 Overweight Nutritional Risks: None Diabetes: No  How often do you need to have someone help you when you read instructions, pamphlets, or other written materials from your doctor or pharmacy?: 1 - Never  Diabetic?no  Interpreter Needed?: No  Information entered by :: Lanier Ensign, lPN   Activities of Daily Living    12/14/2022   10:29 AM  In your present state of health, do you have any difficulty performing the following activities:  Hearing? 0  Vision? 0  Difficulty concentrating or making decisions? 0  Walking or climbing stairs? 0  Dressing or bathing? 0  Doing errands, shopping? 0  Preparing Food and eating ? N  Using the Toilet? N  In the past six months, have you accidently leaked urine? N  Do you have problems with loss of bowel control? N  Managing your Medications? N  Managing your Finances? N  Housekeeping or managing your Housekeeping? N    Patient Care Team: Ardith Dark, MD as PCP - General (Family Medicine)  Indicate any recent Medical Services you may have received from other than [facility] providers in the past year (date may be approximate).     Assessment:   This is a routine wellness examination for  Anavey.  Hearing/Vision screen Hearing Screening - Comments:: Pt denies any hearing issues  Vision Screening - Comments:: Pt follows up with [facility] for annual eye exams   Dietary issues and exercise activities discussed: Current Exercise Habits: Home exercise routine, Type of exercise: Other - see comments (varies), Time (Minutes): > 60, Frequency (Times/Week): 3, Weekly Exercise (Minutes/Week): 0   Goals Addressed             This Visit's Progress    Patient Stated       None at this time        Depression Screen    12/18/2022   11:06 AM 11/03/2022   11:20 AM 12/26/2021    1:01 PM 12/05/2021   10:56 AM  10/08/2020   11:26 AM  PHQ 2/9 Scores  PHQ - 2 Score 0 0 0 0 0    Fall Risk    12/14/2022   10:29 AM 11/03/2022   11:21 AM 12/26/2021    1:01 PM 12/05/2021   10:58 AM  Fall Risk   Falls in the past year? 0 0 0 0  Number falls in past yr: 0 0 0 0  Injury with Fall? 0 0 0 0  Risk for fall due to : Impaired vision No Fall Risks No Fall Risks Impaired vision  Follow up Falls prevention discussed   Falls prevention discussed    FALL RISK PREVENTION PERTAINING TO THE HOME:  Any stairs in or around the home? No  If so, are there any without handrails? No  Home free of loose throw rugs in walkways, pet beds, electrical cords, etc? Yes  Adequate lighting in your home to reduce risk of falls? Yes   ASSISTIVE DEVICES UTILIZED TO PREVENT FALLS:  Life alert? No  Use of a cane, walker or w/c? No  Grab bars in the bathroom? Yes  Shower chair or bench in shower? No  Elevated toilet seat or a handicapped toilet? No   TIMED UP AND GO:  Was the test performed? No .   Cognitive Function:        12/18/2022   11:09 AM 12/05/2021   11:01 AM  6CIT Screen  What Year? 0 points 0 points  What month? 0 points 0 points  What time? 0 points 0 points  Count back from 20 0 points 0 points  Months in reverse 0 points 0 points  Repeat phrase 0 points 0 points  Total Score 0 points 0  points    Immunizations Immunization History  Administered Date(s) Administered   Covid-19, Mrna,Vaccine(Spikevax)69yrs and older 11/20/2022   Fluad Quad(high Dose 65+) 05/19/2022   Influenza, High Dose Seasonal PF 05/24/2021   Influenza-Unspecified 06/04/2020   PFIZER(Purple Top)SARS-COV-2 Vaccination 11/18/2019, 12/10/2019, 07/07/2020, 06/10/2021   Pneumococcal Conjugate-13 10/05/2015   Pneumococcal Polysaccharide-23 12/25/2016   Respiratory Syncytial Virus Vaccine,Recomb Aduvanted(Arexvy) 08/18/2022   Tdap 12/26/2021   Zoster Recombinat (Shingrix) 07/21/2019, 10/25/2020    TDAP status: Up to date  Flu Vaccine status: Up to date  Pneumococcal vaccine status: Up to date  Covid-19 vaccine status: Completed vaccines  Qualifies for Shingles Vaccine? Yes   Zostavax completed Yes   Shingrix Completed?: Yes  Screening Tests Health Maintenance  Topic Date Due   Hepatitis C Screening  Never done   Fecal DNA (Cologuard)  12/18/2023 (Originally 07/[birth year]/1992)   INFLUENZA VACCINE  04/05/2023   Medicare Annual Wellness (AWV)  12/18/2023   DTaP/Tdap/Td (2 - Td or Tdap) 12/27/2031   Pneumonia Vaccine 51+ Years old  Completed   DEXA SCAN  Completed   COVID-19 Vaccine  Completed   Zoster Vaccines- Shingrix  Completed   HPV VACCINES  Aged Out    Health Maintenance  Health Maintenance Due  Topic Date Due   Hepatitis C Screening  Never done    Colorectal cancer screening: No longer required. Per pt   Mammogram status: Completed 12/15/22. Repeat every year  Bone Density status: Completed 03/09/21. Results reflect: Bone density results: OSTEOPENIA. Repeat every 2 years.   Additional Screening:  Hepatitis C Screening: does qualify;  Vision Screening: Recommended annual ophthalmology exams for early detection of glaucoma and other disorders of the eye. Is the patient up to date with their annual eye exam?  Yes  Who  is the provider or what is the name of the office in which the  patient attends annual eye exams? [facility]  If pt is not established with a provider, would they like to be referred to a provider to establish care? No .   Dental Screening: Recommended annual dental exams for proper oral hygiene  Community Resource Referral / Chronic Care Management: CRR required this visit?  No   CCM required this visit?  No      Plan:     I have personally reviewed and noted the following in the patient's chart:   Medical and social history Use of alcohol, tobacco or illicit drugs  Current medications and supplements including opioid prescriptions. Patient is not currently taking opioid prescriptions. Functional ability and status Nutritional status Physical activity Advanced directives List of other physicians Hospitalizations, surgeries, and ER visits in previous 12 months Vitals Screenings to include cognitive, depression, and falls Referrals and appointments  In addition, I have reviewed and discussed with patient certain preventive protocols, quality metrics, and best practice recommendations. A written personalized care plan for preventive services as well as general preventive health recommendations were provided to patient.     Marzella Schlein, LPN   1/61/0960   Nurse Notes: none

## 2022-12-18 NOTE — Patient Instructions (Signed)
[name] , Thank you for taking time to come for your Medicare Wellness Visit. I appreciate your ongoing commitment to your health goals. Please review the following plan we discussed and let me know if I can assist you in the future.   These are the goals we discussed:  Goals      Patient Stated     None at this time      Patient Stated     None at this time         This is a list of the screening recommended for you and due dates:  Health Maintenance  Topic Date Due   Hepatitis C Screening: USPSTF Recommendation to screen - Ages 81-79 yo.  Never done   Cologuard (Stool DNA test)  12/18/2023*   Flu Shot  04/05/2023   Medicare Annual Wellness Visit  12/18/2023   DTaP/Tdap/Td vaccine (2 - Td or Tdap) 12/27/2031   Pneumonia Vaccine  Completed   DEXA scan (bone density measurement)  Completed   COVID-19 Vaccine  Completed   Zoster (Shingles) Vaccine  Completed   HPV Vaccine  Aged Out  *Topic was postponed. The date shown is not the original due date.    Advanced directives: Please bring a copy of your health care power of attorney and living will to the office at your convenience.  Conditions/risks identified: none at this time   Next appointment: Follow up in one year for your annual wellness visit    Preventive Care 65 Years and Older, Female Preventive care refers to lifestyle choices and visits with your health care provider that can promote health and wellness. What does preventive care include? A yearly physical exam. This is also called an annual well check. Dental exams once or twice a year. Routine eye exams. Ask your health care provider how often you should have your eyes checked. Personal lifestyle choices, including: Daily care of your teeth and gums. Regular physical activity. Eating a healthy diet. Avoiding tobacco and drug use. Limiting alcohol use. Practicing safe sex. Taking low-dose aspirin every day. Taking vitamin and mineral supplements as  recommended by your health care provider. What happens during an annual well check? The services and screenings done by your health care provider during your annual well check will depend on your age, overall health, lifestyle risk factors, and family history of disease. Counseling  Your health care provider may ask you questions about your: Alcohol use. Tobacco use. Drug use. Emotional well-being. Home and relationship well-being. Sexual activity. Eating habits. History of falls. Memory and ability to understand (cognition). Work and work Astronomer. Reproductive health. Screening  You may have the following tests or measurements: Height, weight, and BMI. Blood pressure. Lipid and cholesterol levels. These may be checked every 5 years, or more frequently if you are over 69 years old. Skin check. Lung cancer screening. You may have this screening every year starting at age 74 if you have a 30-pack-year history of smoking and currently smoke or have quit within the past 15 years. Fecal occult blood test (FOBT) of the stool. You may have this test every year starting at age 38. Flexible sigmoidoscopy or colonoscopy. You may have a sigmoidoscopy every 5 years or a colonoscopy every 10 years starting at age 16. Hepatitis C blood test. Hepatitis B blood test. Sexually transmitted disease (STD) testing. Diabetes screening. This is done by checking your blood sugar (glucose) after you have not eaten for a while (fasting). You may have this done  every 1-3 years. Bone density scan. This is done to screen for osteoporosis. You may have this done starting at age 82. Mammogram. This may be done every 1-2 years. Talk to your health care provider about how often you should have regular mammograms. Talk with your health care provider about your test results, treatment options, and if necessary, the need for more tests. Vaccines  Your health care provider may recommend certain vaccines, such  as: Influenza vaccine. This is recommended every year. Tetanus, diphtheria, and acellular pertussis (Tdap, Td) vaccine. You may need a Td booster every 10 years. Zoster vaccine. You may need this after age 53. Pneumococcal 13-valent conjugate (PCV13) vaccine. One dose is recommended after age 88. Pneumococcal polysaccharide (PPSV23) vaccine. One dose is recommended after age 7. Talk to your health care provider about which screenings and vaccines you need and how often you need them. This information is not intended to replace advice given to you by your health care provider. Make sure you discuss any questions you have with your health care provider. Document Released: 09/17/2015 Document Revised: 05/10/2016 Document Reviewed: 06/22/2015 Elsevier Interactive Patient Education  2017 [place] Prevention in the Home Falls can cause injuries. They can happen to people of all ages. There are many things you can do to make your home safe and to help prevent falls. What can I do on the outside of my home? Regularly fix the edges of walkways and driveways and fix any cracks. Remove anything that might make you trip as you walk through a door, such as a raised step or threshold. Trim any bushes or trees on the path to your home. Use bright outdoor lighting. Clear any walking paths of anything that might make someone trip, such as rocks or tools. Regularly check to see if handrails are loose or broken. Make sure that both sides of any steps have handrails. Any raised decks and porches should have guardrails on the edges. Have any leaves, snow, or ice cleared regularly. Use sand or salt on walking paths during winter. Clean up any spills in your garage right away. This includes oil or grease spills. What can I do in the bathroom? Use night lights. Install grab bars by the toilet and in the tub and shower. Do not use towel bars as grab bars. Use non-skid mats or decals in the tub or  shower. If you need to sit down in the shower, use a plastic, non-slip stool. Keep the floor dry. Clean up any water that spills on the floor as soon as it happens. Remove soap buildup in the tub or shower regularly. Attach bath mats securely with double-sided non-slip rug tape. Do not have throw rugs and other things on the floor that can make you trip. What can I do in the bedroom? Use night lights. Make sure that you have a light by your bed that is easy to reach. Do not use any sheets or blankets that are too big for your bed. They should not hang down onto the floor. Have a firm chair that has side arms. You can use this for support while you get dressed. Do not have throw rugs and other things on the floor that can make you trip. What can I do in the kitchen? Clean up any spills right away. Avoid walking on wet floors. Keep items that you use a lot in easy-to-reach places. If you need to reach something above you, use a strong step stool that has  a grab bar. Keep electrical cords out of the way. Do not use floor polish or wax that makes floors slippery. If you must use wax, use non-skid floor wax. Do not have throw rugs and other things on the floor that can make you trip. What can I do with my stairs? Do not leave any items on the stairs. Make sure that there are handrails on both sides of the stairs and use them. Fix handrails that are broken or loose. Make sure that handrails are as long as the stairways. Check any carpeting to make sure that it is firmly attached to the stairs. Fix any carpet that is loose or worn. Avoid having throw rugs at the top or bottom of the stairs. If you do have throw rugs, attach them to the floor with carpet tape. Make sure that you have a light switch at the top of the stairs and the bottom of the stairs. If you do not have them, ask someone to add them for you. What else can I do to help prevent falls? Wear shoes that: Do not have high heels. Have  rubber bottoms. Are comfortable and fit you well. Are closed at the toe. Do not wear sandals. If you use a stepladder: Make sure that it is fully opened. Do not climb a closed stepladder. Make sure that both sides of the stepladder are locked into place. Ask someone to hold it for you, if possible. Clearly mark and make sure that you can see: Any grab bars or handrails. First and last steps. Where the edge of each step is. Use tools that help you move around (mobility aids) if they are needed. These include: Canes. Walkers. Scooters. Crutches. Turn on the lights when you go into a dark area. Replace any light bulbs as soon as they burn out. Set up your furniture so you have a clear path. Avoid moving your furniture around. If any of your floors are uneven, fix them. If there are any pets around you, be aware of where they are. Review your medicines with your doctor. Some medicines can make you feel dizzy. This can increase your chance of falling. Ask your doctor what other things that you can do to help prevent falls. This information is not intended to replace advice given to you by your health care provider. Make sure you discuss any questions you have with your health care provider. Document Released: 06/17/2009 Document Revised: 01/27/2016 Document Reviewed: 09/25/2014 Elsevier Interactive Patient Education  2017 [employer].

## 2023-01-22 ENCOUNTER — Other Ambulatory Visit: Payer: Self-pay | Admitting: Family Medicine

## 2023-03-01 DIAGNOSIS — H608X3 Other otitis externa, bilateral: Secondary | ICD-10-CM | POA: Diagnosis not present

## 2023-03-01 DIAGNOSIS — H6122 Impacted cerumen, left ear: Secondary | ICD-10-CM | POA: Diagnosis not present

## 2023-03-01 DIAGNOSIS — H903 Sensorineural hearing loss, bilateral: Secondary | ICD-10-CM | POA: Diagnosis not present

## 2023-03-16 ENCOUNTER — Other Ambulatory Visit: Payer: Self-pay | Admitting: Family Medicine

## 2023-05-01 ENCOUNTER — Other Ambulatory Visit: Payer: Self-pay | Admitting: Family Medicine

## 2023-05-24 DIAGNOSIS — Z2989 Encounter for other specified prophylactic measures: Secondary | ICD-10-CM | POA: Diagnosis not present

## 2023-05-24 DIAGNOSIS — Z23 Encounter for immunization: Secondary | ICD-10-CM | POA: Diagnosis not present

## 2023-06-14 ENCOUNTER — Other Ambulatory Visit: Payer: Self-pay | Admitting: Family Medicine

## 2023-07-11 ENCOUNTER — Other Ambulatory Visit: Payer: Self-pay | Admitting: Family Medicine

## 2023-09-10 ENCOUNTER — Other Ambulatory Visit: Payer: Self-pay | Admitting: Family Medicine

## 2023-09-22 ENCOUNTER — Other Ambulatory Visit: Payer: Self-pay | Admitting: Family Medicine

## 2023-10-15 ENCOUNTER — Other Ambulatory Visit: Payer: Self-pay | Admitting: Family Medicine

## 2023-10-21 ENCOUNTER — Encounter: Payer: Self-pay | Admitting: Family Medicine

## 2023-10-22 ENCOUNTER — Other Ambulatory Visit: Payer: Self-pay | Admitting: *Deleted

## 2023-10-22 NOTE — Telephone Encounter (Signed)
 Rx Omeprazole not in current medication list  Rx NEBIVOLOL  last refill on 08/10/2021 #90 with 3 ref

## 2023-10-23 ENCOUNTER — Other Ambulatory Visit: Payer: Self-pay | Admitting: Family Medicine

## 2023-10-23 MED ORDER — NEBIVOLOL HCL 10 MG PO TABS: 10.0000 mg | ORAL_TABLET | Freq: Every day | ORAL | 3 refills | Status: DC

## 2023-10-24 ENCOUNTER — Other Ambulatory Visit: Payer: Self-pay | Admitting: *Deleted

## 2023-10-24 MED ORDER — OMEPRAZOLE 20 MG PO CPDR: 20.0000 mg | DELAYED_RELEASE_CAPSULE | Freq: Every day | ORAL | 3 refills | Status: DC

## 2023-10-24 NOTE — Telephone Encounter (Signed)
 Last ordered: 3 years ago (10/08/2020) by Historical Provider, MD

## 2023-11-05 DIAGNOSIS — H524 Presbyopia: Secondary | ICD-10-CM | POA: Diagnosis not present

## 2023-11-05 DIAGNOSIS — H2513 Age-related nuclear cataract, bilateral: Secondary | ICD-10-CM | POA: Diagnosis not present

## 2023-11-05 DIAGNOSIS — H52223 Regular astigmatism, bilateral: Secondary | ICD-10-CM | POA: Diagnosis not present

## 2023-11-05 DIAGNOSIS — Z0100 Encounter for examination of eyes and vision without abnormal findings: Secondary | ICD-10-CM | POA: Diagnosis not present

## 2023-11-05 DIAGNOSIS — H43813 Vitreous degeneration, bilateral: Secondary | ICD-10-CM | POA: Diagnosis not present

## 2023-11-05 DIAGNOSIS — H5203 Hypermetropia, bilateral: Secondary | ICD-10-CM | POA: Diagnosis not present

## 2023-11-07 ENCOUNTER — Telehealth: Payer: Self-pay | Admitting: *Deleted

## 2023-11-07 DIAGNOSIS — I10 Essential (primary) hypertension: Secondary | ICD-10-CM

## 2023-11-07 DIAGNOSIS — E785 Hyperlipidemia, unspecified: Secondary | ICD-10-CM

## 2023-11-07 NOTE — Telephone Encounter (Signed)
 Copied from CRM [id number]. Topic: Appointments - Appointment Info/Confirmation >> Nov 07, 2023  9:45 AM Truddie Crumble wrote: Patient/patient representative is calling for information regarding an appointment. Patient called stating she would like blood work done but there are no orders in to make an appointment   Patient need office visit withPCP Elvenia Godden,RMA

## 2023-11-07 NOTE — Telephone Encounter (Signed)
 Please schedule an office visit.

## 2023-11-07 NOTE — Telephone Encounter (Signed)
 Please make sure she has a visit scheduled.  Madison King. Jimmey Ralph, MD 11/07/2023 10:46 AM

## 2023-11-08 ENCOUNTER — Encounter: Payer: Medicare HMO | Admitting: Family Medicine

## 2023-11-09 NOTE — Addendum Note (Signed)
 Addended by: Jimmye Norman on: 11/09/2023 09:49 AM   Modules accepted: Orders

## 2023-11-09 NOTE — Telephone Encounter (Signed)
 LVM to schedule labs 1 week prior to CPE.

## 2023-11-09 NOTE — Telephone Encounter (Signed)
 Lab orders placed, please schedule lab appt prior to physical.

## 2023-12-05 ENCOUNTER — Other Ambulatory Visit: Payer: Self-pay | Admitting: Family Medicine

## 2023-12-05 DIAGNOSIS — Z1231 Encounter for screening mammogram for malignant neoplasm of breast: Secondary | ICD-10-CM

## 2023-12-18 ENCOUNTER — Other Ambulatory Visit: Payer: Self-pay | Admitting: Family Medicine

## 2023-12-24 ENCOUNTER — Ambulatory Visit (INDEPENDENT_AMBULATORY_CARE_PROVIDER_SITE_OTHER): Payer: Medicare HMO

## 2023-12-24 VITALS — Ht 64.0 in | Wt 143.0 lb

## 2023-12-24 DIAGNOSIS — Z0000 Encounter for general adult medical examination without abnormal findings: Secondary | ICD-10-CM | POA: Diagnosis not present

## 2023-12-24 NOTE — Progress Notes (Addendum)
 Subjective:   Madison King is a 78 y.o. who presents for a Medicare Wellness preventive visit.  Visit Complete: Virtual I connected with  Madison King on 12/24/23 by a audio enabled telemedicine application and verified that I am speaking with the correct person using two identifiers.  Patient Location: Home  Provider Location: Office/Clinic  I discussed the limitations of evaluation and management by telemedicine. The patient expressed understanding and agreed to proceed.  Vital Signs: Because this visit was a virtual/telehealth visit, some criteria may be missing or patient reported. Any vitals not documented were not able to be obtained and vitals that have been documented are patient reported.  VideoDeclined- This patient declined Librarian, academic. Therefore the visit was completed with audio only.  Persons Participating in Visit: Patient.  AWV Questionnaire: Yes: Patient Medicare AWV questionnaire was completed by the patient on 12/20/23; I have confirmed that all information answered by patient is correct and no changes since this date.  Cardiac Risk Factors include: advanced age (>74men, >11 women);dyslipidemia;hypertension     Objective:    Today's Vitals   12/24/23 1054  Weight: 143 lb (64.9 kg)  Height: 5\' 4"  (1.626 m)   Body mass index is 24.55 kg/m.     12/24/2023   11:01 AM 12/18/2022   11:08 AM 12/05/2021   10:57 AM  Advanced Directives  Does Patient Have a Medical Advance Directive? Yes Yes Yes  Type of Estate agent of [place];Living will Healthcare Power of [place];Living will Healthcare Power of Attorney  Copy of Healthcare Power of Attorney in Chart? No - copy requested No - copy requested No - copy requested    Current Medications (verified) Outpatient Encounter Medications as of 12/24/2023  Medication Sig   ALPRAZolam  (XANAX ) 0.25 MG tablet TAKE 1 TABLET BY MOUTH AT BEDTIME AS NEEDED FOR ANXIETY    Ascorbic Acid (VITAMIN C) 500 MG CAPS Take by mouth.   Cholecalciferol (VITAMIN D3) 50 MCG (2000 UT) TABS Take by mouth.   nebivolol  (BYSTOLIC ) 10 MG tablet Take 1 tablet (10 mg total) by mouth daily.   omeprazole  (PRILOSEC) 20 MG capsule Take 1 capsule (20 mg total) by mouth daily.   pravastatin  (PRAVACHOL ) 10 MG tablet TAKE 1 TABLET BY MOUTH EVERY DAY   triamcinolone  (KENALOG ) 0.025 % ointment APPLY TO AFFECTED AREA TWICE A DAY   AREXVY 120 MCG/0.5ML injection    SPIKEVAX injection    No facility-administered encounter medications on file as of 12/24/2023.    Allergies (verified) Aspirin, Latex, and Shellfish allergy   History: Past Medical History:  Diagnosis Date   Anxiety    GERD (gastroesophageal reflux disease)    Hyperlipidemia    Past Surgical History:  Procedure Laterality Date   LITHOTRIPSY     Family History  Problem Relation Age of Onset   Prostate cancer Father    Heart disease Father    Arthritis Mother    Hearing loss Mother    Cancer Brother    Arthritis Maternal Grandmother    Miscarriages / Stillbirths Maternal Grandmother    Heart disease Maternal Grandfather    Cancer Paternal Grandfather    Colon cancer Neg Hx    Social History   Socioeconomic History   Marital status: Divorced    Spouse name: Not on file   Number of children: Not on file   Years of education: Not on file   Highest education level: Master's degree (e.g., MA, MS, MEng, MEd, MSW, MBA)  Occupational History   Not on file  Tobacco Use   Smoking status: Never   Smokeless tobacco: Not on file  Substance and Sexual Activity   Alcohol use: Never   Drug use: Never   Sexual activity: Not on file  Other Topics Concern   Not on file  Social History Narrative   Not on file   Social Drivers of Health   Financial Resource Strain: Low Risk  (12/24/2023)   Overall Financial Resource Strain (CARDIA)    Difficulty of Paying Living Expenses: Not hard at all  Food Insecurity: No Food  Insecurity (12/24/2023)   Hunger Vital Sign    Worried About Running Out of Food in the Last Year: Never true    Ran Out of Food in the Last Year: Never true  Transportation Needs: No Transportation Needs (12/20/2023)   PRAPARE - Administrator, Civil Service (Medical): No    Lack of Transportation (Non-Medical): No  Physical Activity: Sufficiently Active (12/24/2023)   Exercise Vital Sign    Days of Exercise per Week: 3 days    Minutes of Exercise per Session: 60 min  Stress: No Stress Concern Present (12/24/2023)   [employer] of Occupational Health - Occupational Stress Questionnaire    Feeling of Stress : Not at all  Social Connections: Moderately Isolated (12/24/2023)   Social Connection and Isolation Panel [NHANES]    Frequency of Communication with Friends and Family: More than three times a week    Frequency of Social Gatherings with Friends and Family: More than three times a week    Attends Religious Services: Never    Database administrator or Organizations: Yes    Attends Engineer, structural: 1 to 4 times per year    Marital Status: Divorced    Tobacco Counseling Counseling given: Not Answered    Clinical Intake:  Pre-visit preparation completed: Yes  Pain : No/denies pain     BMI - recorded: 24.55 Nutritional Status: BMI of 19-24  Normal Nutritional Risks: None Diabetes: No  No results found for: "HGBA1C"   How often do you need to have someone help you when you read instructions, pamphlets, or other written materials from your doctor or pharmacy?: 1 - Never  Interpreter Needed?: No  Information entered by :: Lamont Pilsner, LPN   Activities of Daily Living     12/20/2023   10:46 AM  In your present state of health, do you have any difficulty performing the following activities:  Hearing? 0  Vision? 0  Difficulty concentrating or making decisions? 0  Walking or climbing stairs? 0  Dressing or bathing? 0  Doing errands,  shopping? 0  Preparing Food and eating ? N  Using the Toilet? N  In the past six months, have you accidently leaked urine? N  Do you have problems with loss of bowel control? N  Managing your Medications? N  Managing your Finances? N  Housekeeping or managing your Housekeeping? N    Patient Care Team: Rodney Clamp, MD as PCP - General (Family Medicine)  Indicate any recent Medical Services you may have received from other than [facility] providers in the past year (date may be approximate).     Assessment:   This is a routine wellness examination for Madison King.  Hearing/Vision screen Hearing Screening - Comments:: Pt denies any hearing issues  Vision Screening - Comments:: Wears rx glasses - up to date with routine eye exams with [facility]  Goals Addressed             This Visit's Progress    Patient Stated       Eat healthier        Depression Screen     12/24/2023   10:59 AM 12/18/2022   11:06 AM 11/03/2022   11:20 AM 12/26/2021    1:01 PM 12/05/2021   10:56 AM 10/08/2020   11:26 AM  PHQ 2/9 Scores  PHQ - 2 Score 0 0 0 0 0 0    Fall Risk     12/20/2023   10:46 AM 12/14/2022   10:29 AM 11/03/2022   11:21 AM 12/26/2021    1:01 PM 12/05/2021   10:58 AM  Fall Risk   Falls in the past year? 0 0 0 0 0  Number falls in past yr: 0 0 0 0 0  Injury with Fall? 0 0 0 0 0  Risk for fall due to : No Fall Risks Impaired vision No Fall Risks No Fall Risks Impaired vision  Follow up Falls prevention discussed Falls prevention discussed   Falls prevention discussed    MEDICARE RISK AT HOME:  Medicare Risk at Home Home free of loose throw rugs in walkways, pet beds, electrical cords, etc?: (Patient-Rptd) Yes Adequate lighting in your home to reduce risk of falls?: (Patient-Rptd) Yes Life alert?: (Patient-Rptd) No Use of a cane, walker or w/c?: (Patient-Rptd) No Grab bars in the bathroom?: (Patient-Rptd) No Shower chair or bench in shower?: (Patient-Rptd) Yes Elevated toilet  seat or a handicapped toilet?: (Patient-Rptd) No  TIMED UP AND GO:  Was the test performed?  No  Cognitive Function: 6CIT completed        12/24/2023   11:02 AM 12/18/2022   11:09 AM 12/05/2021   11:01 AM  6CIT Screen  What Year? 0 points 0 points 0 points  What month? 0 points 0 points 0 points  What time? 0 points 0 points 0 points  Count back from 20 0 points 0 points 0 points  Months in reverse 0 points 0 points 0 points  Repeat phrase 0 points 0 points 0 points  Total Score 0 points 0 points 0 points    Immunizations Immunization History  Administered Date(s) Administered   Fluad Quad(high Dose 65+) 05/19/2022   Influenza, High Dose Seasonal PF 05/24/2021   Influenza-Unspecified 06/04/2020   Moderna Covid-19 Fall Seasonal Vaccine 49yrs & older 11/20/2022   PFIZER(Purple Top)SARS-COV-2 Vaccination 11/18/2019, 12/10/2019, 07/07/2020, 06/10/2021   Pneumococcal Conjugate-13 10/05/2015   Pneumococcal Polysaccharide-23 12/25/2016   Respiratory Syncytial Virus Vaccine,Recomb Aduvanted(Arexvy) 08/18/2022   Tdap 12/26/2021   Unspecified SARS-COV-2 Vaccination 06/03/2022   Zoster Recombinant(Shingrix) 07/21/2019, 10/25/2020    Screening Tests Health Maintenance  Topic Date Due   Hepatitis C Screening  Never done   COVID-19 Vaccine (7 - 2024-25 season) 05/06/2023   INFLUENZA VACCINE  04/04/2024   Medicare Annual Wellness (AWV)  12/23/2024   DTaP/Tdap/Td (2 - Td or Tdap) 12/27/2031   Pneumonia Vaccine 54+ Years old  Completed   DEXA SCAN  Completed   Zoster Vaccines- Shingrix  Completed   HPV VACCINES  Aged Out   Meningococcal B Vaccine  Aged Out    Health Maintenance  Health Maintenance Due  Topic Date Due   Hepatitis C Screening  Never done   COVID-19 Vaccine (7 - 2024-25 season) 05/06/2023   Health Maintenance Items Addressed: See Nurse Notes  Additional Screening:  Vision Screening: Recommended annual ophthalmology exams for early  detection of glaucoma and  other disorders of the eye.  Dental Screening: Recommended annual dental exams for proper oral hygiene  Community Resource Referral / Chronic Care Management: CRR required this visit?  No   CCM required this visit?  No     Plan:     I have personally reviewed and noted the following in the patient's chart:   Medical and social history Use of alcohol, tobacco or illicit drugs  Current medications and supplements including opioid prescriptions. Patient is not currently taking opioid prescriptions. Functional ability and status Nutritional status Physical activity Advanced directives List of other physicians Hospitalizations, surgeries, and ER visits in previous 12 months Vitals Screenings to include cognitive, depression, and falls Referrals and appointments  In addition, I have reviewed and discussed with patient certain preventive protocols, quality metrics, and best practice recommendations. A written personalized care plan for preventive services as well as general preventive health recommendations were provided to patient.     Bruno Capri, LPN   8/41/3244   After Visit Summary: (MyChart) Due to this being a telephonic visit, the after visit summary with patients personalized plan was offered to patient via MyChart   Notes: Nothing significant to report at this time.

## 2023-12-24 NOTE — Patient Instructions (Signed)
 Madison King , Thank you for taking time to come for your Medicare Wellness Visit. I appreciate your ongoing commitment to your health goals. Please review the following plan we discussed and let me know if I can assist you in the future.   Referrals/Orders/Follow-Ups/Clinician Recommendations: Continue to eat healthier   This is a list of the screening recommended for you and due dates:  Health Maintenance  Topic Date Due   Hepatitis C Screening  Never done   COVID-19 Vaccine (6 - 2024-25 season) 05/06/2023   Medicare Annual Wellness Visit  12/18/2023   Flu Shot  04/04/2024   DTaP/Tdap/Td vaccine (2 - Td or Tdap) 12/27/2031   Pneumonia Vaccine  Completed   DEXA scan (bone density measurement)  Completed   Zoster (Shingles) Vaccine  Completed   HPV Vaccine  Aged Out   Meningitis B Vaccine  Aged Out    Advanced directives: (Copy Requested) Please bring a copy of your health care power of attorney and living will to the office to be added to your chart at your convenience. You can mail to [facility] [address] [place], [place] [address] or email to [email or web address]  Next Medicare Annual Wellness Visit scheduled for next year: Yes

## 2024-01-08 ENCOUNTER — Ambulatory Visit
Admission: RE | Admit: 2024-01-08 | Discharge: 2024-01-08 | Disposition: A | Discharge status: Home / Self Care | Source: Ambulatory Visit | Attending: Family Medicine | Admitting: Family Medicine

## 2024-01-08 DIAGNOSIS — Z1231 Encounter for screening mammogram for malignant neoplasm of breast: Secondary | ICD-10-CM

## 2024-01-22 ENCOUNTER — Other Ambulatory Visit (INDEPENDENT_AMBULATORY_CARE_PROVIDER_SITE_OTHER)

## 2024-01-22 DIAGNOSIS — E785 Hyperlipidemia, unspecified: Secondary | ICD-10-CM | POA: Diagnosis not present

## 2024-01-22 DIAGNOSIS — I10 Essential (primary) hypertension: Secondary | ICD-10-CM | POA: Diagnosis not present

## 2024-01-22 LAB — COMPREHENSIVE METABOLIC PANEL WITH GFR
ALT: 12 U/L (ref 0–35)
AST: 20 U/L (ref 0–37)
Albumin: 4.2 g/dL (ref 3.5–5.2)
Alkaline Phosphatase: 66 U/L (ref 39–117)
BUN: 23 mg/dL (ref 6–23)
CO2: 25 meq/L (ref 19–32)
Calcium: 9.4 mg/dL (ref 8.4–10.5)
Chloride: 105 meq/L (ref 96–112)
Creatinine, Ser: 0.92 mg/dL (ref 0.40–1.20)
GFR: 60.47 mL/min (ref >60.00)
Glucose, Bld: 111 mg/dL — ABNORMAL HIGH (ref 70–99)
Potassium: 4.1 meq/L (ref 3.5–5.1)
Sodium: 141 meq/L (ref 135–145)
Total Bilirubin: 0.7 mg/dL (ref 0.2–1.2)
Total Protein: 7.3 g/dL (ref 6.0–8.3)

## 2024-01-22 LAB — CBC WITH DIFFERENTIAL/PLATELET
Basophils Absolute: 0 10*3/uL (ref 0.0–0.1)
Basophils Relative: 0.6 % (ref 0.0–3.0)
Eosinophils Absolute: 0.1 10*3/uL (ref 0.0–0.7)
Eosinophils Relative: 1.5 % (ref 0.0–5.0)
HCT: 43.1 % (ref 36.0–46.0)
Hemoglobin: 14.6 g/dL (ref 12.0–15.0)
Lymphocytes Relative: 32.8 % (ref 12.0–46.0)
Lymphs Abs: 2.1 10*3/uL (ref 0.7–4.0)
MCHC: 33.9 g/dL (ref 30.0–36.0)
MCV: 90 fl (ref 78.0–100.0)
Monocytes Absolute: 0.5 10*3/uL (ref 0.1–1.0)
Monocytes Relative: 8 % (ref 3.0–12.0)
Neutro Abs: 3.6 10*3/uL (ref 1.4–7.7)
Neutrophils Relative %: 57.1 % (ref 43.0–77.0)
Platelets: 237 10*3/uL (ref 150.0–400.0)
RBC: 4.79 Mil/uL (ref 3.87–5.11)
RDW: 12.7 % (ref 11.5–15.5)
WBC: 6.4 10*3/uL (ref 4.0–10.5)

## 2024-01-22 LAB — LIPID PANEL
Cholesterol: 174 mg/dL (ref 0–200)
HDL: 34.2 mg/dL — ABNORMAL LOW (ref >39.00)
LDL Cholesterol: 112 mg/dL — ABNORMAL HIGH (ref 0–99)
NonHDL: 139.62
Total CHOL/HDL Ratio: 5
Triglycerides: 139 mg/dL (ref 0.0–149.0)
VLDL: 27.8 mg/dL (ref 0.0–40.0)

## 2024-01-22 LAB — TSH: TSH: 1.75 u[IU]/mL (ref 0.35–5.50)

## 2024-01-23 ENCOUNTER — Ambulatory Visit: Payer: Self-pay | Admitting: Family Medicine

## 2024-01-23 DIAGNOSIS — E785 Hyperlipidemia, unspecified: Secondary | ICD-10-CM

## 2024-01-23 NOTE — Progress Notes (Signed)
 Cholesterol and blood sugar are borderline elevated but otherwise labs are all stable.  Do not need to make any changes to treatment plan.  We can discuss more at her upcoming appointment.

## 2024-01-29 ENCOUNTER — Encounter: Payer: Self-pay | Admitting: Family Medicine

## 2024-01-29 ENCOUNTER — Ambulatory Visit (INDEPENDENT_AMBULATORY_CARE_PROVIDER_SITE_OTHER): Admitting: Family Medicine

## 2024-01-29 VITALS — BP 138/76 | HR 56 | Temp 97.5°F | Ht 64.0 in | Wt 140.8 lb

## 2024-01-29 DIAGNOSIS — F419 Anxiety disorder, unspecified: Secondary | ICD-10-CM

## 2024-01-29 DIAGNOSIS — E785 Hyperlipidemia, unspecified: Secondary | ICD-10-CM

## 2024-01-29 DIAGNOSIS — I10 Essential (primary) hypertension: Secondary | ICD-10-CM

## 2024-01-29 DIAGNOSIS — Z0001 Encounter for general adult medical examination with abnormal findings: Secondary | ICD-10-CM | POA: Diagnosis not present

## 2024-01-29 DIAGNOSIS — Z23 Encounter for immunization: Secondary | ICD-10-CM | POA: Diagnosis not present

## 2024-01-29 DIAGNOSIS — L309 Dermatitis, unspecified: Secondary | ICD-10-CM | POA: Insufficient documentation

## 2024-01-29 DIAGNOSIS — Z1159 Encounter for screening for other viral diseases: Secondary | ICD-10-CM

## 2024-01-29 NOTE — Assessment & Plan Note (Signed)
 Stable on topical triamcinolone  as needed.  Does not need refill today.

## 2024-01-29 NOTE — Progress Notes (Signed)
 Chief Complaint:  Madison King is a 78 y.o. female who presents today for her annual comprehensive physical exam.    Assessment/Plan:  Chronic Problems Addressed Today: Anxiety Stable on alprazolam  as needed.  Uses very sparingly.  Tolerates well.  No significant side effects.  Does not need refill today.  Eczema Stable on topical triamcinolone  as needed.  Does not need refill today.  Essential hypertension Initially elevated but at goal on recheck.  Continue Bystolic  10 mg daily.  She will monitor at home and let us  know if persistently elevated.  Preventative Healthcare: Check MMR and hep C antibodies.  Up-to-date on other vaccines.  She declines future colonoscopies.  Patient Counseling(The following topics were reviewed and/or handout was given):  -Nutrition: Stressed importance of moderation in sodium/caffeine intake, saturated fat and cholesterol, caloric balance, sufficient intake of fresh fruits, vegetables, and fiber.  -Stressed the importance of regular exercise.   -Substance Abuse: Discussed cessation/primary prevention of tobacco, alcohol, or other drug use; driving or other dangerous activities under the influence; availability of treatment for abuse.   -Injury prevention: Discussed safety belts, safety helmets, smoke detector, smoking near bedding or upholstery.   -Sexuality: Discussed sexually transmitted diseases, partner selection, use of condoms, avoidance of unintended pregnancy and contraceptive alternatives.   -Dental health: Discussed importance of regular tooth brushing, flossing, and dental visits.  -Health maintenance and immunizations reviewed. Please refer to Health maintenance section.  Return to care in 1 year for next preventative visit.     Subjective:  HPI:  She has no acute complaints today. See Assessment / plan for status of chronic conditions.   Lifestyle Diet: None specific.  Exercise: Trying to walk more frequently.      01/29/2024     2:14 PM  Depression screen PHQ 2/9  Decreased Interest 0  Down, Depressed, Hopeless 0  PHQ - 2 Score 0  Altered sleeping 0  Tired, decreased energy 0  Change in appetite 0  Feeling bad or failure about yourself  0  Trouble concentrating 0  Moving slowly or fidgety/restless 0  Suicidal thoughts 0  PHQ-9 Score 0  Difficult doing work/chores Not difficult at all   There are no preventive care reminders to display for this patient.   ROS: Per HPI, otherwise a complete review of systems was negative.   PMH:  The following were reviewed and entered/updated in epic: Past Medical History:  Diagnosis Date   Anxiety    GERD (gastroesophageal reflux disease)    Hyperlipidemia    Patient Active Problem List   Diagnosis Date Noted   Eczema 01/29/2024   Osteopenia - Last DEXA 2022 03/10/2021   Chronic neck pain 12/27/2020   Anxiety 10/08/2020   Vitamin D deficiency 10/08/2020   Essential hypertension 10/08/2020   GERD (gastroesophageal reflux disease) 10/08/2020   Dyslipidemia 10/08/2020   Past Surgical History:  Procedure Laterality Date   LITHOTRIPSY      Family History  Problem Relation Age of Onset   Arthritis Mother    Hearing loss Mother    Prostate cancer Father    Heart disease Father    Arthritis Maternal Grandmother    Miscarriages / Stillbirths Maternal Grandmother    Heart disease Maternal Grandfather    Cancer Paternal Grandfather    Cancer Brother    Colon cancer Neg Hx    BRCA 1/2 Neg Hx    Breast cancer Neg Hx     Medications- reviewed and updated Current Outpatient Medications  Medication Sig  Dispense Refill   ALPRAZolam  (XANAX ) 0.25 MG tablet TAKE 1 TABLET BY MOUTH AT BEDTIME AS NEEDED FOR ANXIETY 30 tablet 1   AREXVY 120 MCG/0.5ML injection      Ascorbic Acid (VITAMIN C) 500 MG CAPS Take by mouth.     Cholecalciferol (VITAMIN D3) 50 MCG (2000 UT) TABS Take by mouth.     nebivolol  (BYSTOLIC ) 10 MG tablet Take 1 tablet (10 mg total) by mouth  daily. 90 tablet 3   omeprazole  (PRILOSEC) 20 MG capsule Take 1 capsule (20 mg total) by mouth daily. 90 capsule 3   pravastatin  (PRAVACHOL ) 10 MG tablet TAKE 1 TABLET BY MOUTH EVERY DAY 90 tablet 0   SPIKEVAX injection      triamcinolone  (KENALOG ) 0.025 % ointment APPLY TO AFFECTED AREA TWICE A DAY 30 g 0   No current facility-administered medications for this visit.    Allergies-reviewed and updated Allergies  Allergen Reactions   Aspirin    Latex    Shellfish Allergy     Social History   Socioeconomic History   Marital status: Divorced    Spouse name: Not on file   Number of children: Not on file   Years of education: Not on file   Highest education level: Master's degree (e.g., MA, MS, MEng, MEd, MSW, MBA)  Occupational History   Not on file  Tobacco Use   Smoking status: Never   Smokeless tobacco: Not on file  Substance and Sexual Activity   Alcohol use: Never   Drug use: Never   Sexual activity: Not on file  Other Topics Concern   Not on file  Social History Narrative   Not on file   Social Drivers of Health   Financial Resource Strain: Low Risk  (12/24/2023)   Overall Financial Resource Strain (CARDIA)    Difficulty of Paying Living Expenses: Not hard at all  Food Insecurity: No Food Insecurity (12/24/2023)   Hunger Vital Sign    Worried About Running Out of Food in the Last Year: Never true    Ran Out of Food in the Last Year: Never true  Transportation Needs: No Transportation Needs (12/20/2023)   PRAPARE - Administrator, Civil Service (Medical): No    Lack of Transportation (Non-Medical): No  Physical Activity: Sufficiently Active (12/24/2023)   Exercise Vital Sign    Days of Exercise per Week: 3 days    Minutes of Exercise per Session: 60 min  Stress: No Stress Concern Present (12/24/2023)   [employer] of Occupational Health - Occupational Stress Questionnaire    Feeling of Stress : Not at all  Social Connections: Moderately Isolated  (12/24/2023)   Social Connection and Isolation Panel [NHANES]    Frequency of Communication with Friends and Family: More than three times a week    Frequency of Social Gatherings with Friends and Family: More than three times a week    Attends Religious Services: Never    Database administrator or Organizations: Yes    Attends Engineer, structural: 1 to 4 times per year    Marital Status: Divorced        Objective:  Physical Exam: BP 138/76   Pulse (!) 56   Temp (!) 97.5 F (36.4 C) (Temporal)   Ht 5\' 4"  (1.626 m)   Wt 140 lb 12.8 oz (63.9 kg)   SpO2 98%   BMI 24.17 kg/m   Body mass index is 24.17 kg/m. Wt Readings from Last 3 Encounters:  01/29/24 140 lb 12.8 oz (63.9 kg)  12/24/23 143 lb (64.9 kg)  12/18/22 143 lb (64.9 kg)   Gen: NAD, resting comfortably HEENT: TMs normal bilaterally. OP clear. No thyromegaly noted.  CV: RRR with no murmurs appreciated Pulm: NWOB, CTAB with no crackles, wheezes, or rhonchi GI: Normal bowel sounds present. Soft, Nontender, Nondistended. MSK: no edema, cyanosis, or clubbing noted Skin: warm, dry Neuro: CN2-12 grossly intact. Strength 5/5 in upper and lower extremities. Reflexes symmetric and intact bilaterally.  Psych: Normal affect and thought content     Ernestene Coover M. Daneil Dunker, MD 01/29/2024 2:54 PM

## 2024-01-29 NOTE — Assessment & Plan Note (Signed)
 Initially elevated but at goal on recheck.  Continue Bystolic  10 mg daily.  She will monitor at home and let us  know if persistently elevated.

## 2024-01-29 NOTE — Patient Instructions (Signed)
 It was very nice to see you today!  We will check blood work today.  Please continue to work on diet and exercise.  Please monitor your blood pressure at home and let us  know if persistently elevated.  I will see you back in year for your next physical.  Please come back sooner if needed.  Return in about 1 year (around 01/28/2025) for Annual Physical.   Take care, Dr Daneil Dunker  PLEASE NOTE:  If you had any lab tests, please let us  know if you have not heard back within a few days. You may see your results on mychart before we have a chance to review them but we will give you a call once they are reviewed by us .   If we ordered any referrals today, please let us  know if you have not heard from their office within the next week.   If you had any urgent prescriptions sent in today, please check with the pharmacy within an hour of our visit to make sure the prescription was transmitted appropriately.   Please try these tips to maintain a healthy lifestyle:  Eat at least 3 REAL meals and 1-2 snacks per day.  Aim for no more than 5 hours between eating.  If you eat breakfast, please do so within one hour of getting up.   Each meal should contain half fruits/vegetables, one quarter protein, and one quarter carbs (no bigger than a computer mouse)  Cut down on sweet beverages. This includes juice, soda, and sweet tea.   Drink at least 1 glass of water with each meal and aim for at least 8 glasses per day  Exercise at least 150 minutes every week.    Preventive Care 67 Years and Older, Female Preventive care refers to lifestyle choices and visits with your health care provider that can promote health and wellness. Preventive care visits are also called wellness exams. What can I expect for my preventive care visit? Counseling Your health care provider may ask you questions about your: Medical history, including: Past medical problems. Family medical history. Pregnancy and menstrual  history. History of falls. Current health, including: Memory and ability to understand (cognition). Emotional well-being. Home life and relationship well-being. Sexual activity and sexual health. Lifestyle, including: Alcohol, nicotine or tobacco, and drug use. Access to firearms. Diet, exercise, and sleep habits. Work and work Astronomer. Sunscreen use. Safety issues such as seatbelt and bike helmet use. Physical exam Your health care provider will check your: Height and weight. These may be used to calculate your BMI (body mass index). BMI is a measurement that tells if you are at a healthy weight. Waist circumference. This measures the distance around your waistline. This measurement also tells if you are at a healthy weight and may help predict your risk of certain diseases, such as type 2 diabetes and high blood pressure. Heart rate and blood pressure. Body temperature. Skin for abnormal spots. What immunizations do I need?  Vaccines are usually given at various ages, according to a schedule. Your health care provider will recommend vaccines for you based on your age, medical history, and lifestyle or other factors, such as travel or where you work. What tests do I need? Screening Your health care provider may recommend screening tests for certain conditions. This may include: Lipid and cholesterol levels. Hepatitis C test. Hepatitis B test. HIV (human immunodeficiency virus) test. STI (sexually transmitted infection) testing, if you are at risk. Lung cancer screening. Colorectal cancer screening. Diabetes screening.  This is done by checking your blood sugar (glucose) after you have not eaten for a while (fasting). Mammogram. Talk with your health care provider about how often you should have regular mammograms. BRCA-related cancer screening. This may be done if you have a family history of breast, ovarian, tubal, or peritoneal cancers. Bone density scan. This is done to  screen for osteoporosis. Talk with your health care provider about your test results, treatment options, and if necessary, the need for more tests. Follow these instructions at home: Eating and drinking  Eat a diet that includes fresh fruits and vegetables, whole grains, lean protein, and low-fat dairy products. Limit your intake of foods with high amounts of sugar, saturated fats, and salt. Take vitamin and mineral supplements as recommended by your health care provider. Do not drink alcohol if your health care provider tells you not to drink. If you drink alcohol: Limit how much you have to 0-1 drink a day. Know how much alcohol is in your drink. In the [place] one drink equals one 12 oz bottle of beer (355 mL), one 5 oz glass of wine (148 mL), or one 1 oz glass of hard liquor (44 mL). Lifestyle Brush your teeth every morning and night with fluoride toothpaste. Floss one time each day. Exercise for at least 30 minutes 5 or more days each week. Do not use any products that contain nicotine or tobacco. These products include cigarettes, chewing tobacco, and vaping devices, such as e-cigarettes. If you need help quitting, ask your health care provider. Do not use drugs. If you are sexually active, practice safe sex. Use a condom or other form of protection in order to prevent STIs. Take aspirin only as told by your health care provider. Make sure that you understand how much to take and what form to take. Work with your health care provider to find out whether it is safe and beneficial for you to take aspirin daily. Ask your health care provider if you need to take a cholesterol-lowering medicine (statin). Find healthy ways to manage stress, such as: Meditation, yoga, or listening to music. Journaling. Talking to a trusted person. Spending time with friends and family. Minimize exposure to UV radiation to reduce your risk of skin cancer. Safety Always wear your seat belt while driving or  riding in a vehicle. Do not drive: If you have been drinking alcohol. Do not ride with someone who has been drinking. When you are tired or distracted. While texting. If you have been using any mind-altering substances or drugs. Wear a helmet and other protective equipment during sports activities. If you have firearms in your house, make sure you follow all gun safety procedures. What's next? Visit your health care provider once a year for an annual wellness visit. Ask your health care provider how often you should have your eyes and teeth checked. Stay up to date on all vaccines. This information is not intended to replace advice given to you by your health care provider. Make sure you discuss any questions you have with your health care provider. Document Revised: 02/16/2021 Document Reviewed: 02/16/2021 Elsevier Patient Education  2024 [employer].

## 2024-01-29 NOTE — Assessment & Plan Note (Signed)
 Stable on alprazolam  as needed.  Uses very sparingly.  Tolerates well.  No significant side effects.  Does not need refill today.

## 2024-01-31 LAB — HEPATITIS C ANTIBODY: Hepatitis C Ab: NONREACTIVE

## 2024-01-31 LAB — MEASLES/MUMPS/RUBELLA IMMUNITY
Mumps IgG: >300 [AU]/ml
Rubella: 32.2 {index}
Rubeola IgG: >300 [AU]/ml

## 2024-02-01 ENCOUNTER — Telehealth: Payer: Self-pay | Admitting: *Deleted

## 2024-02-01 ENCOUNTER — Telehealth: Payer: Self-pay

## 2024-02-01 ENCOUNTER — Ambulatory Visit: Payer: Self-pay | Admitting: Family Medicine

## 2024-02-01 NOTE — Progress Notes (Signed)
 Her test for measles, mumps, and rubella shows that she has sufficient antibodies.  She does not need a booster for this.    Hepatitis C test was negative.

## 2024-02-01 NOTE — Telephone Encounter (Signed)
 Copied from CRM [id number]. Topic: Clinical - Lab/Test Results >> Feb 01, 2024  4:14 PM [place] E wrote: Reason for CRM: Patient called in regarding her lab results. Relayed to patient the note that was attached to these labs from PCP and patient did not have any further questions.'    Noted.

## 2024-02-01 NOTE — Telephone Encounter (Signed)
 Copied from CRM [id number]. Topic: General - Other >> Feb 01, 2024 10:59 AM [place] A wrote: Reason for CRM: Patient was returning call from Nurse regarding labs- please call back  See previews note

## 2024-02-15 ENCOUNTER — Other Ambulatory Visit: Payer: Self-pay | Admitting: Family Medicine

## 2024-02-29 ENCOUNTER — Telehealth: Payer: Self-pay | Admitting: *Deleted

## 2024-02-29 NOTE — Telephone Encounter (Signed)
 Copied from CRM [id number]. Topic: Clinical - Medication Question >> Feb 29, 2024  2:06 PM Drema MATSU wrote: Reason for CRM: Patient wants to know if Doctor can call in Amoxicillin for her gums to [facility] on [address].    Patient need OV for Antibiotic prescription  Francois Elk,RMA

## 2024-03-03 ENCOUNTER — Ambulatory Visit (INDEPENDENT_AMBULATORY_CARE_PROVIDER_SITE_OTHER): Admitting: Family Medicine

## 2024-03-03 ENCOUNTER — Encounter: Payer: Self-pay | Admitting: Family Medicine

## 2024-03-03 VITALS — BP 144/79 | HR 51 | Temp 97.8°F | Ht 64.0 in | Wt 142.2 lb

## 2024-03-03 DIAGNOSIS — K0530 Chronic periodontitis, unspecified: Secondary | ICD-10-CM | POA: Diagnosis not present

## 2024-03-03 DIAGNOSIS — I10 Essential (primary) hypertension: Secondary | ICD-10-CM

## 2024-03-03 MED ORDER — AMOXICILLIN 500 MG PO CAPS: 500.0000 mg | ORAL_CAPSULE | Freq: Three times a day (TID) | ORAL | 0 refills | Status: AC

## 2024-03-03 NOTE — Progress Notes (Signed)
   Madison King is a 78 y.o. female who presents today for an office visit.  Assessment/Plan:  New/Acute Problems: Periodontitis  No red flags. We will refill her amoxicillin that she has used for this in the past and has done well.  Advised her to follow-up with her dentistry soon after the holiday weekend.  Chronic Problems Addressed Today: Essential hypertension Mildly elevated today.  Typically well-controlled.  She was well-controlled at her most recent visit here few weeks ago.  She will continue Bystolic  10 mg daily.  She can continue monitor at home and let us  know if persistently elevated.    Subjective:  HPI:  See assessment / plan for status of chronic conditions. She is here today for dental pain. This has been an intermittent issue for awhile but started developing sore gums the last several days. She has used amoxicillin in the past for this which works well. She contacted her dentist but they are out of the office for this week.  No reported fevers or chills.  No lymphadenopathy.  No bleeding gums.       Objective:  Physical Exam: BP (!) 144/79   Pulse (!) 51   Temp 97.8 F (36.6 C) (Temporal)   Ht 5' 4 (1.626 m)   Wt 142 lb 3.2 oz (64.5 kg)   SpO2 100%   BMI 24.41 kg/m   Gen: No acute distress, resting comfortably HEENT: Several dental fillings noted.  Slight erythema noted to oral mucosa.  No areas of fluctuance or edema. CV: Regular rate and rhythm with no murmurs appreciated Pulm: Normal work of breathing, clear to auscultation bilaterally with no crackles, wheezes, or rhonchi Neuro: Grossly normal, moves all extremities Psych: Normal affect and thought content      Samia Kukla M. Kennyth, MD 03/03/2024 2:16 PM

## 2024-03-03 NOTE — Assessment & Plan Note (Signed)
 Mildly elevated today.  Typically well-controlled.  She was well-controlled at her most recent visit here few weeks ago.  She will continue Bystolic  10 mg daily.  She can continue monitor at home and let us  know if persistently elevated.

## 2024-03-03 NOTE — Patient Instructions (Addendum)
 It was very nice to see you today!  I will refill your amoxicillin.  Please let us  know if not improving.  Please follow-up with your dentist soon.  Please monitor your blood pressure at home and let us  know if persistently elevated.  Return if symptoms worsen or fail to improve.   Take care, Dr Kennyth  PLEASE NOTE:  If you had any lab tests, please let us  know if you have not heard back within a few days. You may see your results on mychart before we have a chance to review them but we will give you a call once they are reviewed by us .   If we ordered any referrals today, please let us  know if you have not heard from their office within the next week.   If you had any urgent prescriptions sent in today, please check with the pharmacy within an hour of our visit to make sure the prescription was transmitted appropriately.   Please try these tips to maintain a healthy lifestyle:  Eat at least 3 REAL meals and 1-2 snacks per day.  Aim for no more than 5 hours between eating.  If you eat breakfast, please do so within one hour of getting up.   Each meal should contain half fruits/vegetables, one quarter protein, and one quarter carbs (no bigger than a computer mouse)  Cut down on sweet beverages. This includes juice, soda, and sweet tea.   Drink at least 1 glass of water with each meal and aim for at least 8 glasses per day  Exercise at least 150 minutes every week.

## 2024-03-30 ENCOUNTER — Other Ambulatory Visit: Payer: Self-pay | Admitting: Family Medicine

## 2024-05-26 DIAGNOSIS — Z23 Encounter for immunization: Secondary | ICD-10-CM | POA: Diagnosis not present

## 2024-06-01 ENCOUNTER — Other Ambulatory Visit: Payer: Self-pay | Admitting: Family Medicine

## 2024-06-05 DIAGNOSIS — H5203 Hypermetropia, bilateral: Secondary | ICD-10-CM | POA: Diagnosis not present

## 2024-06-05 DIAGNOSIS — H43813 Vitreous degeneration, bilateral: Secondary | ICD-10-CM | POA: Diagnosis not present

## 2024-06-05 DIAGNOSIS — H2513 Age-related nuclear cataract, bilateral: Secondary | ICD-10-CM | POA: Diagnosis not present

## 2024-06-05 DIAGNOSIS — H5319 Other subjective visual disturbances: Secondary | ICD-10-CM | POA: Diagnosis not present

## 2024-06-05 DIAGNOSIS — H524 Presbyopia: Secondary | ICD-10-CM | POA: Diagnosis not present

## 2024-06-05 DIAGNOSIS — H52223 Regular astigmatism, bilateral: Secondary | ICD-10-CM | POA: Diagnosis not present

## 2024-08-03 ENCOUNTER — Other Ambulatory Visit: Payer: Self-pay | Admitting: Family Medicine

## 2024-10-06 ENCOUNTER — Other Ambulatory Visit: Payer: Self-pay | Admitting: Family Medicine

## 2024-10-08 ENCOUNTER — Other Ambulatory Visit: Payer: Self-pay | Admitting: Family Medicine

## 2024-12-29 ENCOUNTER — Ambulatory Visit
